# Patient Record
Sex: Male | Born: 1985
Health system: Southern US, Community
[De-identification: ages and names within clinical notes are randomized; demographics above are authoritative.]

## PROBLEM LIST (undated history)

## (undated) DIAGNOSIS — G8929 Other chronic pain: Secondary | ICD-10-CM

## (undated) DIAGNOSIS — Z9119 Patient's noncompliance with other medical treatment and regimen: Secondary | ICD-10-CM

## (undated) DIAGNOSIS — I10 Essential (primary) hypertension: Secondary | ICD-10-CM

## (undated) DIAGNOSIS — I509 Heart failure, unspecified: Secondary | ICD-10-CM

## (undated) DIAGNOSIS — Z91199 Patient's noncompliance with other medical treatment and regimen due to unspecified reason: Secondary | ICD-10-CM

## (undated) DIAGNOSIS — E785 Hyperlipidemia, unspecified: Secondary | ICD-10-CM

## (undated) DIAGNOSIS — I5042 Chronic combined systolic (congestive) and diastolic (congestive) heart failure: Secondary | ICD-10-CM

## (undated) DIAGNOSIS — N182 Chronic kidney disease, stage 2 (mild): Secondary | ICD-10-CM

## (undated) DIAGNOSIS — M549 Dorsalgia, unspecified: Secondary | ICD-10-CM

## (undated) HISTORY — PX: OTHER SURGICAL HISTORY: SHX169

---

## 2005-08-08 ENCOUNTER — Emergency Department (HOSPITAL_COMMUNITY): Admission: EM | Admit: 2005-08-08 | Discharge: 2005-08-08 | Payer: Self-pay | Admitting: Emergency Medicine

## 2005-08-09 ENCOUNTER — Emergency Department (HOSPITAL_COMMUNITY): Admission: EM | Admit: 2005-08-09 | Discharge: 2005-08-09 | Payer: Self-pay | Admitting: Emergency Medicine

## 2007-05-29 ENCOUNTER — Ambulatory Visit (HOSPITAL_COMMUNITY): Admission: EM | Admit: 2007-05-29 | Discharge: 2007-05-30 | Payer: Self-pay | Admitting: Emergency Medicine

## 2007-09-02 ENCOUNTER — Emergency Department (HOSPITAL_BASED_OUTPATIENT_CLINIC_OR_DEPARTMENT_OTHER): Admission: EM | Admit: 2007-09-02 | Discharge: 2007-09-02 | Payer: Self-pay | Admitting: Emergency Medicine

## 2008-03-10 ENCOUNTER — Ambulatory Visit: Payer: Self-pay | Admitting: Diagnostic Radiology

## 2008-03-10 ENCOUNTER — Emergency Department (HOSPITAL_BASED_OUTPATIENT_CLINIC_OR_DEPARTMENT_OTHER): Admission: EM | Admit: 2008-03-10 | Discharge: 2008-03-10 | Payer: Self-pay | Admitting: Emergency Medicine

## 2008-03-10 ENCOUNTER — Emergency Department (HOSPITAL_BASED_OUTPATIENT_CLINIC_OR_DEPARTMENT_OTHER): Admission: EM | Admit: 2008-03-10 | Discharge: 2008-03-11 | Payer: Self-pay | Admitting: Emergency Medicine

## 2008-08-17 ENCOUNTER — Emergency Department (HOSPITAL_BASED_OUTPATIENT_CLINIC_OR_DEPARTMENT_OTHER): Admission: EM | Admit: 2008-08-17 | Discharge: 2008-08-17 | Payer: Self-pay | Admitting: Emergency Medicine

## 2008-08-17 ENCOUNTER — Ambulatory Visit: Payer: Self-pay | Admitting: Diagnostic Radiology

## 2008-08-18 ENCOUNTER — Ambulatory Visit: Payer: Self-pay

## 2008-08-18 ENCOUNTER — Encounter: Payer: Self-pay | Admitting: Internal Medicine

## 2009-04-03 ENCOUNTER — Emergency Department (HOSPITAL_COMMUNITY): Admission: EM | Admit: 2009-04-03 | Discharge: 2009-04-03 | Payer: Self-pay | Admitting: Emergency Medicine

## 2010-05-01 LAB — BASIC METABOLIC PANEL
BUN: 10 mg/dL (ref 6–23)
GFR calc Af Amer: >60 mL/min (ref >60)
Glucose, Bld: 103 mg/dL — ABNORMAL HIGH (ref 70–99)
Sodium: 140 mEq/L (ref 135–145)

## 2010-05-01 LAB — DIFFERENTIAL
Basophils Absolute: 0.2 10*3/uL — ABNORMAL HIGH (ref 0.0–0.1)
Eosinophils Absolute: 0 10*3/uL (ref 0.0–0.7)
Eosinophils Relative: 0 % (ref 0–5)
Lymphocytes Relative: 5 % — ABNORMAL LOW (ref 12–46)
Lymphs Abs: 0.4 10*3/uL — ABNORMAL LOW (ref 0.7–4.0)
Monocytes Absolute: 0.7 10*3/uL (ref 0.1–1.0)
Monocytes Relative: 10 % (ref 3–12)
Neutro Abs: 5.8 10*3/uL (ref 1.7–7.7)

## 2010-05-01 LAB — CBC
HCT: 40.8 % (ref 39.0–52.0)
Hemoglobin: 13.8 g/dL (ref 13.0–17.0)
MCHC: 33.9 g/dL (ref 30.0–36.0)
MCV: 88.1 fL (ref 78.0–100.0)
WBC: 7.1 10*3/uL (ref 4.0–10.5)
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
## 2011-06-26 ENCOUNTER — Emergency Department (HOSPITAL_COMMUNITY)
Admission: EM | Admit: 2011-06-26 | Discharge: 2011-06-27 | Discharge status: Against Medical Advice | Payer: Medicaid Other | Attending: Emergency Medicine | Admitting: Emergency Medicine

## 2011-06-26 ENCOUNTER — Encounter (HOSPITAL_COMMUNITY): Payer: Self-pay | Admitting: Emergency Medicine

## 2011-06-26 DIAGNOSIS — N289 Disorder of kidney and ureter, unspecified: Secondary | ICD-10-CM | POA: Insufficient documentation

## 2011-06-26 DIAGNOSIS — E860 Dehydration: Secondary | ICD-10-CM | POA: Insufficient documentation

## 2011-06-26 DIAGNOSIS — R252 Cramp and spasm: Secondary | ICD-10-CM | POA: Insufficient documentation

## 2011-06-26 DIAGNOSIS — R1110 Vomiting, unspecified: Secondary | ICD-10-CM | POA: Insufficient documentation

## 2011-06-26 LAB — POCT I-STAT, CHEM 8
Calcium, Ion: 1.18 mmol/L (ref 1.12–1.32)
Glucose, Bld: 137 mg/dL — ABNORMAL HIGH (ref 70–99)
HCT: 55 % — ABNORMAL HIGH (ref 39.0–52.0)
Potassium: 5 mEq/L (ref 3.5–5.1)
TCO2: 24 mmol/L (ref 0–100)

## 2011-06-26 MED ORDER — SODIUM CHLORIDE 0.9 % IV BOLUS (SEPSIS)
1000.0000 mL | Freq: Once | INTRAVENOUS | Status: AC
  Administered 2011-06-26: 1000 mL via INTRAVENOUS

## 2011-06-26 MED ORDER — SODIUM CHLORIDE 0.9 % IV BOLUS (SEPSIS)
1000.0000 mL | Freq: Once | INTRAVENOUS | Status: AC
  Administered 2011-06-27: 1000 mL via INTRAVENOUS

## 2011-06-26 MED ORDER — POTASSIUM CHLORIDE CRYS ER 20 MEQ PO TBCR
40.0000 meq | EXTENDED_RELEASE_TABLET | Freq: Once | ORAL | Status: AC
  Administered 2011-06-26: 40 meq via ORAL
  Filled 2011-06-26: qty 2

## 2011-06-26 MED ORDER — DIAZEPAM 5 MG PO TABS
5.0000 mg | ORAL_TABLET | Freq: Once | ORAL | Status: AC
  Administered 2011-06-26: 5 mg via ORAL
  Filled 2011-06-26: qty 1

## 2011-06-26 MED ORDER — ONDANSETRON HCL 4 MG/2ML IJ SOLN
4.0000 mg | Freq: Once | INTRAMUSCULAR | Status: AC
  Administered 2011-06-26: 4 mg via INTRAVENOUS
  Filled 2011-06-26: qty 2

## 2011-06-26 NOTE — ED Notes (Signed)
Pt made aware of need for urine. Pt states not able to void at this time. Nurse at bedside while explaining to pt. Pt will notify staff if need to urinate.

## 2011-06-26 NOTE — ED Provider Notes (Signed)
History     CSN: [id number]  Arrival date & time 06/26/11  2048   First MD Initiated Contact with Patient 06/26/11 2311      Chief Complaint  Patient presents with  . Abdominal Cramping  . Emesis    (Consider location/radiation/quality/duration/timing/severity/associated sxs/prior treatment) The history is provided by the patient.   Works Aeronautical engineer. Has history of cramping and low potassium and believes that is going on tonight. Symptoms started around 4 PM after working in the heat all day long. Patient states last time this happened he required 6 bags of fluids and potassium. He is requesting something for muscle spasms. He has cramping in his legs and abdomen. He denies any fevers. Decreased urine output today. He denies alcohol use. No medications. Patient also concerned because he very much wants to be able to work in the morning. No fall or trauma. No nausea vomiting or diarrhea. No sick contacts at home. Moderate in severity.  History reviewed. No pertinent past medical history.  History reviewed. No pertinent past surgical history.  History reviewed. No pertinent family history.  History  Substance Use Topics  . Smoking status: Never Smoker   . Smokeless tobacco: Not on file  . Alcohol Use: No      Review of Systems  Constitutional: Negative for fever and chills.  HENT: Negative for neck pain and neck stiffness.   Eyes: Negative for pain.  Respiratory: Negative for shortness of breath.   Cardiovascular: Negative for chest pain.  Gastrointestinal: Negative for blood in stool and abdominal distention.  Genitourinary: Negative for dysuria.  Musculoskeletal: Negative for back pain.  Skin: Negative for rash.  Neurological: Negative for headaches.  All other systems reviewed and are negative.    Allergies  Review of patient's allergies indicates no known allergies.  Home Medications   Current Outpatient Rx  Name Route Sig Dispense Refill  . IBUPROFEN 200 MG  PO TABS Oral Take 200 mg by mouth every 6 (six) hours as needed. Pain      BP 142/83  Pulse 110  Temp 97 F (36.1 C) (Oral)  Resp 16  SpO2 98%  Physical Exam  Constitutional: He is oriented to person, place, and time. He appears well-developed and well-nourished.  HENT:  Head: Normocephalic and atraumatic.       Dry mucous membranes  Eyes: Conjunctivae and EOM are normal. Pupils are equal, round, and reactive to light.  Neck: Trachea normal. Neck supple. No thyromegaly present.  Cardiovascular: Regular rhythm, S1 normal, S2 normal and normal pulses.     No systolic murmur is present   No diastolic murmur is present  Pulses:      Radial pulses are 2+ on the right side, and 2+ on the left side.       Tachycardia  Pulmonary/Chest: Effort normal and breath sounds normal. He has no wheezes. He has no rhonchi. He has no rales. He exhibits no tenderness.  Abdominal: Soft. Normal appearance and bowel sounds are normal. There is no tenderness. There is no CVA tenderness and negative Murphy's sign.  Musculoskeletal:       BLE:s Calves nontender, no cords or erythema, negative Homans sign  Neurological: He is alert and oriented to person, place, and time. He has normal strength. No cranial nerve deficit or sensory deficit. GCS eye subscore is 4. GCS verbal subscore is 5. GCS motor subscore is 6.  Skin: Skin is warm and dry. No rash noted. He is not diaphoretic.  Psychiatric: His speech  is normal.       Cooperative and appropriate    ED Course  Procedures (including critical care time)  Results for orders placed during the hospital encounter of 06/26/11  CBC      Component Value Range   WBC 17.4 (*) 4.0 - 10.5 K/uL   RBC 5.70  4.22 - 5.81 MIL/uL   Hemoglobin 16.8  13.0 - 17.0 g/dL   HCT 16.1  09.6 - 04.5 %   MCV 85.8  78.0 - 100.0 fL   MCH 29.5  26.0 - 34.0 pg   MCHC 34.4  30.0 - 36.0 g/dL   RDW 40.9  81.1 - 91.4 %   Platelets 270  150 - 400 K/uL  URINALYSIS, ROUTINE W REFLEX  MICROSCOPIC      Component Value Range   Color, Urine YELLOW  YELLOW   APPearance CLEAR  CLEAR   Specific Gravity, Urine 1.019  1.005 - 1.030   pH 5.5  5.0 - 8.0   Glucose, UA NEGATIVE  NEGATIVE mg/dL   Hgb urine dipstick NEGATIVE  NEGATIVE   Bilirubin Urine NEGATIVE  NEGATIVE   Ketones, ur NEGATIVE  NEGATIVE mg/dL   Protein, ur 782 (*) NEGATIVE mg/dL   Urobilinogen, UA 0.2  0.0 - 1.0 mg/dL   Nitrite NEGATIVE  NEGATIVE   Leukocytes, UA NEGATIVE  NEGATIVE  POCT I-STAT, CHEM 8      Component Value Range   Sodium 136  135 - 145 mEq/L   Potassium 5.0  3.5 - 5.1 mEq/L   Chloride 101  96 - 112 mEq/L   BUN 28 (*) 6 - 23 mg/dL   Creatinine, Ser 9.56 (*) 0.50 - 1.35 mg/dL   Glucose, Bld 213 (*) 70 - 99 mg/dL   Calcium, Ion 0.86  5.78 - 1.32 mmol/L   TCO2 24  0 - 100 mmol/L   Hemoglobin 18.7 (*) 13.0 - 17.0 g/dL   HCT 46.9 (*) 62.9 - 52.8 %  URINE MICROSCOPIC-ADD ON      Component Value Range   Squamous Epithelial / LPF FEW (*) RARE   WBC, UA 0-2  <3 WBC/hpf   RBC / HPF 0-2  <3 RBC/hpf   Casts HYALINE CASTS (*) NEGATIVE   Crystals CA OXALATE CRYSTALS (*) NEGATIVE   Urine-Other TRICHOMONAS PRESENT     IV fluids provided. Valium for cramping provided.   After 2 L IV fluids, labs reviewed as above. I discussed with patient need for further hydration given elevated creatinine. He declines this and very much wants to leave AMA. Making urine and UA pending. Patient stated understanding my recommendations for further IV fluids and evaluation.  Prior to reassessment patient had nurse remove IV and left AMA.   MDM   Clinical dehydration with muscle cramping, improving in ED. Concerning labs as above. Nursing notes reviewed. Patient left AMA. UA reviewed after patient left. Trichomonas noted.        Sunnie Nielsen, MD 06/27/11 [phone]

## 2011-06-26 NOTE — ED Notes (Signed)
Pt reports abdominal cramping with nausea and vomiting, symptoms on and off since 4pm today, states he can not keep anything down. Pt also reports cramping on both his legs.  Pt also states "my chest feels like closing up"-- denies coughing. Denies headache.

## 2011-06-26 NOTE — ED Notes (Signed)
Pt has been up and down in the lobby, coming up to the window being loud about having to wait.  He has been in out of the front door at least once and c/o not being registered.  He had been called right after arriving by EMS, once in waiting room but must have gone outside when he was called.  Pt's family member had also gotten loud yelling out that there were a lot of people that went ahead of him.

## 2011-06-26 NOTE — ED Notes (Signed)
Attempting to triage and take vital signs on the patient. He is unable to sit still and obtain a BP. Wife at the chair. The patient reports that he water. While attempting a BP he took cuff off got up and went out into the waiting room to obtain water. The since the RN refused to give the patient water while triaging the patient. The patient refused to have his blood pressure checked. Patient was asked to wait in the waiting.

## 2011-06-26 NOTE — ED Notes (Signed)
Patient has been outside all day. Patient reports cramping and vomiting. The patient feels that he has drank enough water today. Patient brought in by ems

## 2011-06-27 LAB — URINALYSIS, ROUTINE W REFLEX MICROSCOPIC
Bilirubin Urine: NEGATIVE
Hgb urine dipstick: NEGATIVE
Leukocytes, UA: NEGATIVE
Protein, ur: 100 mg/dL — AB
Urobilinogen, UA: 0.2 mg/dL (ref 0.0–1.0)
pH: 5.5 (ref 5.0–8.0)

## 2011-06-27 LAB — CBC
Hemoglobin: 16.8 g/dL (ref 13.0–17.0)
MCV: 85.8 fL (ref 78.0–100.0)
Platelets: 270 10*3/uL (ref 150–400)
RBC: 5.7 MIL/uL (ref 4.22–5.81)
RDW: 12.6 % (ref 11.5–15.5)

## 2011-06-27 LAB — URINE MICROSCOPIC-ADD ON

## 2011-06-27 MED ORDER — SODIUM CHLORIDE 0.9 % IV BOLUS (SEPSIS)
1000.0000 mL | Freq: Once | INTRAVENOUS | Status: AC
  Administered 2011-06-27: 1000 mL via INTRAVENOUS

## 2011-06-27 NOTE — ED Notes (Signed)
Pt family informed Clinical research associate that pt is still not able to urinate. Informed pt of high need for urine. Pt states "I will not be able to urinate while here because I am not drinking anything." Informed pt that he is receiving fluids so he should be able to void soon.

## 2011-11-20 ENCOUNTER — Other Ambulatory Visit: Payer: Self-pay | Admitting: Internal Medicine

## 2011-11-20 ENCOUNTER — Ambulatory Visit
Admission: RE | Admit: 2011-11-20 | Discharge: 2011-11-20 | Disposition: A | Discharge status: Home / Self Care | Payer: Medicaid Other | Source: Ambulatory Visit | Attending: Internal Medicine | Admitting: Internal Medicine

## 2011-11-20 DIAGNOSIS — M25532 Pain in left wrist: Secondary | ICD-10-CM

## 2012-11-15 ENCOUNTER — Encounter (HOSPITAL_COMMUNITY): Payer: Self-pay | Admitting: Emergency Medicine

## 2012-11-15 ENCOUNTER — Emergency Department (HOSPITAL_COMMUNITY)
Admission: EM | Admit: 2012-11-15 | Discharge: 2012-11-15 | Disposition: A | Discharge status: Home / Self Care | Payer: Medicaid Other | Attending: Emergency Medicine | Admitting: Emergency Medicine

## 2012-11-15 DIAGNOSIS — M6580 Other synovitis and tenosynovitis, unspecified site: Secondary | ICD-10-CM | POA: Insufficient documentation

## 2012-11-15 DIAGNOSIS — M779 Enthesopathy, unspecified: Secondary | ICD-10-CM

## 2012-11-15 MED ORDER — IBUPROFEN 800 MG PO TABS: 800.0000 mg | ORAL_TABLET | Freq: Three times a day (TID) | ORAL | Status: DC

## 2012-11-15 NOTE — ED Provider Notes (Signed)
CSN: [id number]     Arrival date & time 11/15/12  1251 History  This chart was scribed for non-physician practitioner, Teressa Lower, NP working with Doug Sou, MD by Greggory Stallion, ED scribe. This patient was seen in room WTR7/WTR7 and the patient's care was started at 1:41 PM.   Chief Complaint  Patient presents with  . Numbness   The history is provided by the patient. No language interpreter was used.   HPI Comments: Stephen Ruiz is a 27 y.o. male who presents to the Emergency Department complaining of intermittent right hand numbness with associated upper arm pain that started 1-2 weeks ago. He states the episodes occur randomly. When they do occur, he states his hand changes color and his hand starts to swell. The episodes normally last about 2 hours and resolve on their own. The symptoms started before he started his new job. He states he has been dropping things because he can't hold onto them. Pt states it worsens at night to where he can't bend his fingers at all. He denies injury or heavy lifting. Pt has taken a muscle relaxer and ibuprofen with no relief. He denies neck pain.   History reviewed. No pertinent past medical history. No past surgical history on file. No family history on file. History  Substance Use Topics  . Smoking status: Never Smoker   . Smokeless tobacco: Not on file  . Alcohol Use: No    Review of Systems  Neurological: Positive for numbness.  All other systems reviewed and are negative.    Allergies  Review of patient's allergies indicates no known allergies.  Home Medications   Current Outpatient Rx  Name  Route  Sig  Dispense  Refill  . ibuprofen (ADVIL,MOTRIN) 200 MG tablet   Oral   Take 200 mg by mouth every 6 (six) hours as needed. Pain          BP 124/81  Pulse 83  Temp(Src) 99.5 F (37.5 C) (Oral)  Resp 16  SpO2 97%  Physical Exam  Nursing note and vitals reviewed. Constitutional: He is oriented to person, place,  and time. He appears well-developed and well-nourished. No distress.  HENT:  Head: Normocephalic and atraumatic.  Eyes: EOM are normal.  Neck: Neck supple. No tracheal deviation present.  Cardiovascular: Normal rate.   Pulmonary/Chest: Effort normal. No respiratory distress.  Musculoskeletal: Normal range of motion.  Pt tender in the medical right elbow  Neurological: He is alert and oriented to person, place, and time.  Equal grip strength bilaterally.  Skin: Skin is warm and dry.  Pulses intact:hand warm to touch  Psychiatric: He has a normal mood and affect. His behavior is normal.    ED Course  Procedures (including critical care time)  DIAGNOSTIC STUDIES: Oxygen Saturation is 97% on RA, normal by my interpretation.    COORDINATION OF CARE: 1:26 PM-Discussed treatment plan which includes referral to hand with pt at bedside and pt agreed to plan.   Labs Review Labs Reviewed - No data to display Imaging Review No results found.  EKG Interpretation   None       MDM   1. Tendonitis    Will treat pt symptomatically and refer to hand    I personally performed the services described in this documentation, which was scribed in my presence. The recorded information has been reviewed and is accurate.  Teressa Lower, NP 11/15/12 1420

## 2012-11-15 NOTE — ED Provider Notes (Signed)
Patient reports numbness in right hand and pain in his right hand palmar surface for the past 2 weeks. Pain is worse when he attempts to make a fist. Pain is intermittent. He been using Advil without relief. He is asymptomatic presently. On exam alert Glasgow Coma Score 15 respiratory rate and pulse 2+ all digits with full range of motion grip strength 5 over 5 overall alternates with good capillary refill. No swelling. I suspect the patient was tendinitis. 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 Plan suggest ibuprofen, hand surgeon referral.  Doug Sou, MD 11/15/12 1415

## 2012-11-15 NOTE — ED Notes (Signed)
Pt c/o rt hand numbness x 4 days.  States he has been dropping things due to this.  Denies injury.

## 2012-11-15 NOTE — ED Provider Notes (Signed)
Medical screening examination/treatment/procedure(s) were performed by non-physician practitioner and as supervising physician I was immediately available for consultation/collaboration.  EKG Interpretation   None        Kemonte Ullman, MD 11/15/12 1550 

## 2013-02-25 ENCOUNTER — Emergency Department (HOSPITAL_BASED_OUTPATIENT_CLINIC_OR_DEPARTMENT_OTHER): Payer: Medicaid Other

## 2013-02-25 ENCOUNTER — Encounter (HOSPITAL_BASED_OUTPATIENT_CLINIC_OR_DEPARTMENT_OTHER): Payer: Self-pay | Admitting: Emergency Medicine

## 2013-02-25 ENCOUNTER — Emergency Department (HOSPITAL_BASED_OUTPATIENT_CLINIC_OR_DEPARTMENT_OTHER)
Admission: EM | Admit: 2013-02-25 | Discharge: 2013-02-25 | Disposition: A | Discharge status: Home / Self Care | Payer: Medicaid Other | Attending: Emergency Medicine | Admitting: Emergency Medicine

## 2013-02-25 DIAGNOSIS — Z87828 Personal history of other (healed) physical injury and trauma: Secondary | ICD-10-CM | POA: Insufficient documentation

## 2013-02-25 DIAGNOSIS — Z791 Long term (current) use of non-steroidal anti-inflammatories (NSAID): Secondary | ICD-10-CM | POA: Insufficient documentation

## 2013-02-25 DIAGNOSIS — R52 Pain, unspecified: Secondary | ICD-10-CM | POA: Insufficient documentation

## 2013-02-25 DIAGNOSIS — Z79899 Other long term (current) drug therapy: Secondary | ICD-10-CM | POA: Insufficient documentation

## 2013-02-25 DIAGNOSIS — J069 Acute upper respiratory infection, unspecified: Secondary | ICD-10-CM | POA: Insufficient documentation

## 2013-02-25 MED ORDER — ALBUTEROL SULFATE HFA 108 (90 BASE) MCG/ACT IN AERS: 1.0000 | INHALATION_SPRAY | Freq: Four times a day (QID) | RESPIRATORY_TRACT | Status: DC | PRN

## 2013-02-25 NOTE — Discharge Instructions (Signed)

## 2013-02-25 NOTE — ED Notes (Signed)
Cough x 2 days. Was caring for his daughter who has a virus last week with the same symptoms.
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
## 2013-04-21 ENCOUNTER — Encounter (HOSPITAL_BASED_OUTPATIENT_CLINIC_OR_DEPARTMENT_OTHER): Payer: Self-pay | Admitting: Emergency Medicine

## 2013-04-21 ENCOUNTER — Emergency Department (HOSPITAL_BASED_OUTPATIENT_CLINIC_OR_DEPARTMENT_OTHER): Payer: Medicaid Other

## 2013-04-21 ENCOUNTER — Emergency Department (HOSPITAL_BASED_OUTPATIENT_CLINIC_OR_DEPARTMENT_OTHER)
Admission: EM | Admit: 2013-04-21 | Discharge: 2013-04-22 | Disposition: A | Discharge status: Home / Self Care | Payer: Medicaid Other | Attending: Emergency Medicine | Admitting: Emergency Medicine

## 2013-04-21 DIAGNOSIS — Z791 Long term (current) use of non-steroidal anti-inflammatories (NSAID): Secondary | ICD-10-CM | POA: Insufficient documentation

## 2013-04-21 DIAGNOSIS — H53149 Visual discomfort, unspecified: Secondary | ICD-10-CM | POA: Insufficient documentation

## 2013-04-21 DIAGNOSIS — R51 Headache: Secondary | ICD-10-CM | POA: Insufficient documentation

## 2013-04-21 DIAGNOSIS — R519 Headache, unspecified: Secondary | ICD-10-CM

## 2013-04-21 MED ORDER — KETOROLAC TROMETHAMINE 60 MG/2ML IM SOLN
60.0000 mg | Freq: Once | INTRAMUSCULAR | Status: AC
  Administered 2013-04-22: 60 mg via INTRAMUSCULAR
  Filled 2013-04-21: qty 2

## 2013-04-21 NOTE — ED Notes (Signed)
MD at bedside. 

## 2013-04-21 NOTE — ED Notes (Signed)
Patient transported to CT 
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
## 2013-04-21 NOTE — ED Notes (Signed)
Pt c/o h/a x 2 days 

## 2013-04-22 NOTE — Discharge Instructions (Signed)
Headaches, Frequently Asked Questions °MIGRAINE HEADACHES °Q: What is migraine? What causes it? How can I treat it? °A: Generally, migraine headaches begin as a dull ache. Then they develop into a constant, throbbing, and pulsating pain. You may experience pain at the temples. You may experience pain at the front or back of one or both sides of the head. The pain is usually accompanied by a combination of: °· Nausea. °· Vomiting. °· Sensitivity to light and noise. °Some people (about 15%) experience an aura (see below) before an attack. The cause of migraine is believed to be chemical reactions in the brain. Treatment for migraine may include over-the-counter or prescription medications. It may also include self-help techniques. These include relaxation training and biofeedback.  °Q: What is an aura? °A: About 15% of people with migraine get an "aura". This is a sign of neurological symptoms that occur before a migraine headache. You may see wavy or jagged lines, dots, or flashing lights. You might experience tunnel vision or blind spots in one or both eyes. The aura can include visual or auditory hallucinations (something imagined). It may include disruptions in smell (such as strange odors), taste or touch. Other symptoms include: °· Numbness. °· A "pins and needles" sensation. °· Difficulty in recalling or speaking the correct word. °These neurological events may last as long as 60 minutes. These symptoms will fade as the headache begins. °Q: What is a trigger? °A: Certain physical or environmental factors can lead to or "trigger" a migraine. These include: °· Foods. °· Hormonal changes. °· Weather. °· Stress. °It is important to remember that triggers are different for everyone. To help prevent migraine attacks, you need to figure out which triggers affect you. Keep a headache diary. This is a good way to track triggers. The diary will help you talk to your healthcare professional about your condition. °Q: Does  weather affect migraines? °A: Bright sunshine, hot, humid conditions, and drastic changes in barometric pressure may lead to, or "trigger," a migraine attack in some people. But studies have shown that weather does not act as a trigger for everyone with migraines. °Q: What is the link between migraine and hormones? °A: Hormones start and regulate many of your body's functions. Hormones keep your body in balance within a constantly changing environment. The levels of hormones in your body are unbalanced at times. Examples are during menstruation, pregnancy, or menopause. That can lead to a migraine attack. In fact, about three quarters of all women with migraine report that their attacks are related to the menstrual cycle.  °Q: Is there an increased risk of stroke for migraine sufferers? °A: The likelihood of a migraine attack causing a stroke is very remote. That is not to say that migraine sufferers cannot have a stroke associated with their migraines. In persons under age 40, the most common associated factor for stroke is migraine headache. But over the course of a person's normal life span, the occurrence of migraine headache may actually be associated with a reduced risk of dying from cerebrovascular disease due to stroke.  °Q: What are acute medications for migraine? °A: Acute medications are used to treat the pain of the headache after it has started. Examples over-the-counter medications, NSAIDs, ergots, and triptans.  °Q: What are the triptans? °A: Triptans are the newest class of abortive medications. They are specifically targeted to treat migraine. Triptans are vasoconstrictors. They moderate some chemical reactions in the brain. The triptans work on receptors in your brain. Triptans help   to restore the balance of a neurotransmitter called serotonin. Fluctuations in levels of serotonin are thought to be a main cause of migraine.  °Q: Are over-the-counter medications for migraine effective? °A:  Over-the-counter, or "OTC," medications may be effective in relieving mild to moderate pain and associated symptoms of migraine. But you should see your caregiver before beginning any treatment regimen for migraine.  °Q: What are preventive medications for migraine? °A: Preventive medications for migraine are sometimes referred to as "prophylactic" treatments. They are used to reduce the frequency, severity, and length of migraine attacks. Examples of preventive medications include antiepileptic medications, antidepressants, beta-blockers, calcium channel blockers, and NSAIDs (nonsteroidal anti-inflammatory drugs). °Q: Why are anticonvulsants used to treat migraine? °A: During the past few years, there has been an increased interest in antiepileptic drugs for the prevention of migraine. They are sometimes referred to as "anticonvulsants". Both epilepsy and migraine may be caused by similar reactions in the brain.  °Q: Why are antidepressants used to treat migraine? °A: Antidepressants are typically used to treat people with depression. They may reduce migraine frequency by regulating chemical levels, such as serotonin, in the brain.  °Q: What alternative therapies are used to treat migraine? °A: The term "alternative therapies" is often used to describe treatments considered outside the scope of conventional Western medicine. Examples of alternative therapy include acupuncture, acupressure, and yoga. Another common alternative treatment is herbal therapy. Some herbs are believed to relieve headache pain. Always discuss alternative therapies with your caregiver before proceeding. Some herbal products contain arsenic and other toxins. °TENSION HEADACHES °Q: What is a tension-type headache? What causes it? How can I treat it? °A: Tension-type headaches occur randomly. They are often the result of temporary stress, anxiety, fatigue, or anger. Symptoms include soreness in your temples, a tightening band-like sensation  around your head (a "vice-like" ache). Symptoms can also include a pulling feeling, pressure sensations, and contracting head and neck muscles. The headache begins in your forehead, temples, or the back of your head and neck. Treatment for tension-type headache may include over-the-counter or prescription medications. Treatment may also include self-help techniques such as relaxation training and biofeedback. °CLUSTER HEADACHES °Q: What is a cluster headache? What causes it? How can I treat it? °A: Cluster headache gets its name because the attacks come in groups. The pain arrives with little, if any, warning. It is usually on one side of the head. A tearing or bloodshot eye and a runny nose on the same side of the headache may also accompany the pain. Cluster headaches are believed to be caused by chemical reactions in the brain. They have been described as the most severe and intense of any headache type. Treatment for cluster headache includes prescription medication and oxygen. °SINUS HEADACHES °Q: What is a sinus headache? What causes it? How can I treat it? °A: When a cavity in the bones of the face and skull (a sinus) becomes inflamed, the inflammation will cause localized pain. This condition is usually the result of an allergic reaction, a tumor, or an infection. If your headache is caused by a sinus blockage, such as an infection, you will probably have a fever. An x-ray will confirm a sinus blockage. Your caregiver's treatment might include antibiotics for the infection, as well as antihistamines or decongestants.  °REBOUND HEADACHES °Q: What is a rebound headache? What causes it? How can I treat it? °A: A pattern of taking acute headache medications too often can lead to a condition known as "rebound headache."   A pattern of taking too much headache medication includes taking it more than 2 days per week or in excessive amounts. That means more than the label or a caregiver advises. With rebound  headaches, your medications not only stop relieving pain, they actually begin to cause headaches. Doctors treat rebound headache by tapering the medication that is being overused. Sometimes your caregiver will gradually substitute a different type of treatment or medication. Stopping may be a challenge. Regularly overusing a medication increases the potential for serious side effects. Consult a caregiver if you regularly use headache medications more than 2 days per week or more than the label advises. ADDITIONAL QUESTIONS AND ANSWERS Q: What is biofeedback? A: Biofeedback is a self-help treatment. Biofeedback uses special equipment to monitor your body's involuntary physical responses. Biofeedback monitors:  Breathing.  Pulse.  Heart rate.  Temperature.  Muscle tension.  Brain activity. Biofeedback helps you refine and perfect your relaxation exercises. You learn to control the physical responses that are related to stress. Once the technique has been mastered, you do not need the equipment any more. Q: Are headaches hereditary? A: Four out of five (80%) of people that suffer report a family history of migraine. Scientists are not sure if this is genetic or a family predisposition. Despite the uncertainty, a child has a 50% chance of having migraine if one parent suffers. The child has a 75% chance if both parents suffer.  Q: Can children get headaches? A: By the time they reach high school, most young people have experienced some type of headache. Many safe and effective approaches or medications can prevent a headache from occurring or stop it after it has begun.  Q: What type of doctor should I see to diagnose and treat my headache? A: Start with your primary caregiver. Discuss his or her experience and approach to headaches. Discuss methods of classification, diagnosis, and treatment. Your caregiver may decide to recommend you to a headache specialist, depending upon your symptoms or other  physical conditions. Having diabetes, allergies, etc., may require a more comprehensive and inclusive approach to your headache. The National Headache Foundation will provide, upon request, a list of [facility] physician members in your state. Document Released: 03/23/2003 Document Revised: 03/25/2011 Document Reviewed: 08/31/2007 [facility] Patient Information 2014 [place], [place].  Headaches, Frequently Asked Questions MIGRAINE HEADACHES Q: What is migraine? What causes it? How can I treat it? A: Generally, migraine headaches begin as a dull ache. Then they develop into a constant, throbbing, and pulsating pain. You may experience pain at the temples. You may experience pain at the front or back of one or both sides of the head. The pain is usually accompanied by a combination of:  Nausea.  Vomiting.  Sensitivity to light and noise. Some people (about 15%) experience an aura (see below) before an attack. The cause of migraine is believed to be chemical reactions in the brain. Treatment for migraine may include over-the-counter or prescription medications. It may also include self-help techniques. These include relaxation training and biofeedback.  Q: What is an aura? A: About 15% of people with migraine get an "aura". This is a sign of neurological symptoms that occur before a migraine headache. You may see wavy or jagged lines, dots, or flashing lights. You might experience tunnel vision or blind spots in one or both eyes. The aura can include visual or auditory hallucinations (something imagined). It may include disruptions in smell (such as strange odors), taste or touch. Other symptoms include:  Numbness.  A "pins and needles" sensation.  Difficulty in recalling or speaking the correct word. These neurological events may last as long as 60 minutes. These symptoms will fade as the headache begins. Q: What is a trigger? A: Certain physical or environmental factors can lead to or "trigger" a  migraine. These include:  Foods.  Hormonal changes.  Weather.  Stress. It is important to remember that triggers are different for everyone. To help prevent migraine attacks, you need to figure out which triggers affect you. Keep a headache diary. This is a good way to track triggers. The diary will help you talk to your healthcare professional about your condition. Q: Does weather affect migraines? A: Bright sunshine, hot, humid conditions, and drastic changes in barometric pressure may lead to, or "trigger," a migraine attack in some people. But studies have shown that weather does not act as a trigger for everyone with migraines. Q: What is the link between migraine and hormones? A: Hormones start and regulate many of your body's functions. Hormones keep your body in balance within a constantly changing environment. The levels of hormones in your body are unbalanced at times. Examples are during menstruation, pregnancy, or menopause. That can lead to a migraine attack. In fact, about three quarters of all women with migraine report that their attacks are related to the menstrual cycle.  Q: Is there an increased risk of stroke for migraine sufferers? A: The likelihood of a migraine attack causing a stroke is very remote. That is not to say that migraine sufferers cannot have a stroke associated with their migraines. In persons under age 51, the most common associated factor for stroke is migraine headache. But over the course of a person's normal life span, the occurrence of migraine headache may actually be associated with a reduced risk of dying from cerebrovascular disease due to stroke.  Q: What are acute medications for migraine? A: Acute medications are used to treat the pain of the headache after it has started. Examples over-the-counter medications, NSAIDs, ergots, and triptans.  Q: What are the triptans? A: Triptans are the newest class of abortive medications. They are specifically  targeted to treat migraine. Triptans are vasoconstrictors. They moderate some chemical reactions in the brain. The triptans work on receptors in your brain. Triptans help to restore the balance of a neurotransmitter called serotonin. Fluctuations in levels of serotonin are thought to be a main cause of migraine.  Q: Are over-the-counter medications for migraine effective? A: Over-the-counter, or "OTC," medications may be effective in relieving mild to moderate pain and associated symptoms of migraine. But you should see your caregiver before beginning any treatment regimen for migraine.  Q: What are preventive medications for migraine? A: Preventive medications for migraine are sometimes referred to as "prophylactic" treatments. They are used to reduce the frequency, severity, and length of migraine attacks. Examples of preventive medications include antiepileptic medications, antidepressants, beta-blockers, calcium channel blockers, and NSAIDs (nonsteroidal anti-inflammatory drugs). Q: Why are anticonvulsants used to treat migraine? A: During the past few years, there has been an increased interest in antiepileptic drugs for the prevention of migraine. They are sometimes referred to as "anticonvulsants". Both epilepsy and migraine may be caused by similar reactions in the brain.  Q: Why are antidepressants used to treat migraine? A: Antidepressants are typically used to treat people with depression. They may reduce migraine frequency by regulating chemical levels, such as serotonin, in the brain.  Q: What alternative therapies are used to treat migraine? A: The  term "alternative therapies" is often used to describe treatments considered outside the scope of conventional Western medicine. Examples of alternative therapy include acupuncture, acupressure, and yoga. Another common alternative treatment is herbal therapy. Some herbs are believed to relieve headache pain. Always discuss alternative therapies  with your caregiver before proceeding. Some herbal products contain arsenic and other toxins. TENSION HEADACHES Q: What is a tension-type headache? What causes it? How can I treat it? A: Tension-type headaches occur randomly. They are often the result of temporary stress, anxiety, fatigue, or anger. Symptoms include soreness in your temples, a tightening band-like sensation around your head (a "vice-like" ache). Symptoms can also include a pulling feeling, pressure sensations, and contracting head and neck muscles. The headache begins in your forehead, temples, or the back of your head and neck. Treatment for tension-type headache may include over-the-counter or prescription medications. Treatment may also include self-help techniques such as relaxation training and biofeedback. CLUSTER HEADACHES Q: What is a cluster headache? What causes it? How can I treat it? A: Cluster headache gets its name because the attacks come in groups. The pain arrives with little, if any, warning. It is usually on one side of the head. A tearing or bloodshot eye and a runny nose on the same side of the headache may also accompany the pain. Cluster headaches are believed to be caused by chemical reactions in the brain. They have been described as the most severe and intense of any headache type. Treatment for cluster headache includes prescription medication and oxygen. SINUS HEADACHES Q: What is a sinus headache? What causes it? How can I treat it? A: When a cavity in the bones of the face and skull (a sinus) becomes inflamed, the inflammation will cause localized pain. This condition is usually the result of an allergic reaction, a tumor, or an infection. If your headache is caused by a sinus blockage, such as an infection, you will probably have a fever. An x-ray will confirm a sinus blockage. Your caregiver's treatment might include antibiotics for the infection, as well as antihistamines or decongestants.  REBOUND  HEADACHES Q: What is a rebound headache? What causes it? How can I treat it? A: A pattern of taking acute headache medications too often can lead to a condition known as "rebound headache." A pattern of taking too much headache medication includes taking it more than 2 days per week or in excessive amounts. That means more than the label or a caregiver advises. With rebound headaches, your medications not only stop relieving pain, they actually begin to cause headaches. Doctors treat rebound headache by tapering the medication that is being overused. Sometimes your caregiver will gradually substitute a different type of treatment or medication. Stopping may be a challenge. Regularly overusing a medication increases the potential for serious side effects. Consult a caregiver if you regularly use headache medications more than 2 days per week or more than the label advises. ADDITIONAL QUESTIONS AND ANSWERS Q: What is biofeedback? A: Biofeedback is a self-help treatment. Biofeedback uses special equipment to monitor your body's involuntary physical responses. Biofeedback monitors:  Breathing.  Pulse.  Heart rate.  Temperature.  Muscle tension.  Brain activity. Biofeedback helps you refine and perfect your relaxation exercises. You learn to control the physical responses that are related to stress. Once the technique has been mastered, you do not need the equipment any more. Q: Are headaches hereditary? A: Four out of five (80%) of people that suffer report a family history of migraine. Scientists  are not sure if this is genetic or a family predisposition. Despite the uncertainty, a child has a 50% chance of having migraine if one parent suffers. The child has a 75% chance if both parents suffer.  Q: Can children get headaches? A: By the time they reach high school, most young people have experienced some type of headache. Many safe and effective approaches or medications can prevent a headache  from occurring or stop it after it has begun.  Q: What type of doctor should I see to diagnose and treat my headache? A: Start with your primary caregiver. Discuss his or her experience and approach to headaches. Discuss methods of classification, diagnosis, and treatment. Your caregiver may decide to recommend you to a headache specialist, depending upon your symptoms or other physical conditions. Having diabetes, allergies, etc., may require a more comprehensive and inclusive approach to your headache. The National Headache Foundation will provide, upon request, a list of [facility] physician members in your state. Document Released: 03/23/2003 Document Revised: 03/25/2011 Document Reviewed: 08/31/2007 [facility] Patient Information 2014 [employer], [place].

## 2014-01-04 ENCOUNTER — Emergency Department (HOSPITAL_COMMUNITY)
Admission: EM | Admit: 2014-01-04 | Discharge: 2014-01-04 | Disposition: A | Discharge status: Home / Self Care | Payer: 59 | Attending: Emergency Medicine | Admitting: Emergency Medicine

## 2014-01-04 ENCOUNTER — Emergency Department (HOSPITAL_COMMUNITY): Payer: 59

## 2014-01-04 ENCOUNTER — Encounter (HOSPITAL_COMMUNITY): Payer: Self-pay | Admitting: Emergency Medicine

## 2014-01-04 DIAGNOSIS — M25511 Pain in right shoulder: Secondary | ICD-10-CM

## 2014-01-04 DIAGNOSIS — Y998 Other external cause status: Secondary | ICD-10-CM | POA: Diagnosis not present

## 2014-01-04 DIAGNOSIS — Y929 Unspecified place or not applicable: Secondary | ICD-10-CM | POA: Insufficient documentation

## 2014-01-04 DIAGNOSIS — W11XXXA Fall on and from ladder, initial encounter: Secondary | ICD-10-CM | POA: Insufficient documentation

## 2014-01-04 DIAGNOSIS — Y9389 Activity, other specified: Secondary | ICD-10-CM | POA: Diagnosis not present

## 2014-01-04 DIAGNOSIS — Z79899 Other long term (current) drug therapy: Secondary | ICD-10-CM | POA: Insufficient documentation

## 2014-01-04 DIAGNOSIS — S4991XA Unspecified injury of right shoulder and upper arm, initial encounter: Secondary | ICD-10-CM | POA: Insufficient documentation

## 2014-01-04 DIAGNOSIS — S6991XA Unspecified injury of right wrist, hand and finger(s), initial encounter: Secondary | ICD-10-CM | POA: Insufficient documentation

## 2014-01-04 DIAGNOSIS — R52 Pain, unspecified: Secondary | ICD-10-CM

## 2014-01-04 DIAGNOSIS — M79641 Pain in right hand: Secondary | ICD-10-CM

## 2014-01-04 MED ORDER — TRAMADOL HCL 50 MG PO TABS: 50.0000 mg | ORAL_TABLET | Freq: Four times a day (QID) | ORAL | Status: DC | PRN
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
## 2014-01-04 NOTE — ED Notes (Signed)
XR is backed up. PA notified.

## 2014-01-04 NOTE — ED Notes (Signed)
Pt transported to XR.  

## 2014-01-04 NOTE — ED Notes (Signed)
Pt states that he stepped off the next to bottom rung of a ladder, thinking it was the bottom last week.  C/o rt shoulder and rt hand pain.

## 2014-01-04 NOTE — Discharge Instructions (Signed)

## 2014-05-08 ENCOUNTER — Encounter (HOSPITAL_BASED_OUTPATIENT_CLINIC_OR_DEPARTMENT_OTHER): Payer: Self-pay | Admitting: *Deleted

## 2014-05-08 ENCOUNTER — Emergency Department (HOSPITAL_BASED_OUTPATIENT_CLINIC_OR_DEPARTMENT_OTHER): Payer: 59

## 2014-05-08 ENCOUNTER — Emergency Department (HOSPITAL_BASED_OUTPATIENT_CLINIC_OR_DEPARTMENT_OTHER)
Admission: EM | Admit: 2014-05-08 | Discharge: 2014-05-08 | Discharge status: Against Medical Advice | Payer: 59 | Attending: Emergency Medicine | Admitting: Emergency Medicine

## 2014-05-08 DIAGNOSIS — Y9389 Activity, other specified: Secondary | ICD-10-CM | POA: Insufficient documentation

## 2014-05-08 DIAGNOSIS — Y998 Other external cause status: Secondary | ICD-10-CM | POA: Insufficient documentation

## 2014-05-08 DIAGNOSIS — S81011A Laceration without foreign body, right knee, initial encounter: Secondary | ICD-10-CM | POA: Insufficient documentation

## 2014-05-08 DIAGNOSIS — Z23 Encounter for immunization: Secondary | ICD-10-CM | POA: Diagnosis not present

## 2014-05-08 DIAGNOSIS — S81031A Puncture wound without foreign body, right knee, initial encounter: Secondary | ICD-10-CM | POA: Diagnosis not present

## 2014-05-08 DIAGNOSIS — S8991XA Unspecified injury of right lower leg, initial encounter: Secondary | ICD-10-CM | POA: Diagnosis present

## 2014-05-08 DIAGNOSIS — W260XXA Contact with knife, initial encounter: Secondary | ICD-10-CM | POA: Diagnosis not present

## 2014-05-08 DIAGNOSIS — Y92096 Garden or yard of other non-institutional residence as the place of occurrence of the external cause: Secondary | ICD-10-CM | POA: Diagnosis not present

## 2014-05-08 DIAGNOSIS — T148XXA Other injury of unspecified body region, initial encounter: Secondary | ICD-10-CM

## 2014-05-08 DIAGNOSIS — M25561 Pain in right knee: Secondary | ICD-10-CM

## 2014-05-08 MED ORDER — CEPHALEXIN 500 MG PO CAPS: 500.0000 mg | ORAL_CAPSULE | Freq: Four times a day (QID) | ORAL | Status: DC

## 2014-05-08 MED ORDER — TETANUS-DIPHTH-ACELL PERTUSSIS 5-2.5-18.5 LF-MCG/0.5 IM SUSP
0.5000 mL | Freq: Once | INTRAMUSCULAR | Status: AC
  Administered 2014-05-08: 0.5 mL via INTRAMUSCULAR
  Filled 2014-05-08: qty 0.5
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
## 2014-05-09 ENCOUNTER — Other Ambulatory Visit (HOSPITAL_COMMUNITY): Payer: Self-pay | Admitting: Orthopedic Surgery

## 2014-05-09 DIAGNOSIS — M25561 Pain in right knee: Secondary | ICD-10-CM

## 2014-05-27 ENCOUNTER — Ambulatory Visit (HOSPITAL_COMMUNITY): Payer: 59

## 2015-01-24 DIAGNOSIS — M25561 Pain in right knee: Secondary | ICD-10-CM | POA: Diagnosis not present

## 2015-11-03 ENCOUNTER — Emergency Department (HOSPITAL_COMMUNITY)
Admission: EM | Admit: 2015-11-03 | Discharge: 2015-11-03 | Disposition: A | Discharge status: Home / Self Care | Payer: 59 | Attending: Dermatology | Admitting: Dermatology

## 2015-11-03 ENCOUNTER — Encounter (HOSPITAL_COMMUNITY): Payer: Self-pay | Admitting: Emergency Medicine

## 2015-11-03 DIAGNOSIS — H9202 Otalgia, left ear: Secondary | ICD-10-CM | POA: Insufficient documentation

## 2015-11-03 DIAGNOSIS — R51 Headache: Secondary | ICD-10-CM | POA: Insufficient documentation

## 2015-11-03 DIAGNOSIS — F1290 Cannabis use, unspecified, uncomplicated: Secondary | ICD-10-CM | POA: Insufficient documentation

## 2015-11-03 DIAGNOSIS — Z5321 Procedure and treatment not carried out due to patient leaving prior to being seen by health care provider: Secondary | ICD-10-CM | POA: Insufficient documentation

## 2015-11-03 DIAGNOSIS — Z79899 Other long term (current) drug therapy: Secondary | ICD-10-CM | POA: Insufficient documentation

## 2015-11-03 HISTORY — DX: Dorsalgia, unspecified: M54.9

## 2015-11-03 HISTORY — DX: Other chronic pain: G89.29

## 2015-11-03 MED ORDER — IBUPROFEN 200 MG PO TABS
400.0000 mg | ORAL_TABLET | Freq: Once | ORAL | Status: AC | PRN
  Administered 2015-11-03: 400 mg via ORAL
  Filled 2015-11-03: qty 2

## 2015-11-03 NOTE — ED Notes (Signed)
Pt was called twice with no response.  

## 2015-11-03 NOTE — ED Notes (Signed)
PT called from lobby to minor care room without response.

## 2015-11-03 NOTE — ED Notes (Signed)
Pt called from lobby to minor care room without response.

## 2015-11-03 NOTE — ED Triage Notes (Signed)
Pt complaint of left ear pain without drainage onset 3 days ago. Pt reports associated left headache and facial pain. Pt takes percocet for chronic back pain and "it is not touching this pain."

## 2015-11-03 NOTE — ED Notes (Signed)
Bed: WTR6 Expected date:  Expected time:  Means of arrival:  Comments: 

## 2016-08-10 ENCOUNTER — Encounter (HOSPITAL_BASED_OUTPATIENT_CLINIC_OR_DEPARTMENT_OTHER): Payer: Self-pay | Admitting: Emergency Medicine

## 2016-08-10 ENCOUNTER — Emergency Department (HOSPITAL_BASED_OUTPATIENT_CLINIC_OR_DEPARTMENT_OTHER)
Admission: EM | Admit: 2016-08-10 | Discharge: 2016-08-11 | Disposition: A | Discharge status: Home / Self Care | Payer: Self-pay | Attending: Physician Assistant | Admitting: Physician Assistant

## 2016-08-10 ENCOUNTER — Emergency Department (HOSPITAL_BASED_OUTPATIENT_CLINIC_OR_DEPARTMENT_OTHER): Payer: Self-pay

## 2016-08-10 DIAGNOSIS — F17200 Nicotine dependence, unspecified, uncomplicated: Secondary | ICD-10-CM | POA: Insufficient documentation

## 2016-08-10 DIAGNOSIS — M25511 Pain in right shoulder: Secondary | ICD-10-CM | POA: Insufficient documentation

## 2016-08-10 NOTE — ED Triage Notes (Signed)
PT presents with c/o of right shoulder pain for the past week

## 2016-08-10 NOTE — ED Notes (Signed)
Last two tdays, right shoulder pain feels like stabbing,throbbing and shooting that goes to his neck per patient.
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
## 2016-08-11 MED ORDER — CYCLOBENZAPRINE HCL 10 MG PO TABS: 10.0000 mg | ORAL_TABLET | Freq: Two times a day (BID) | ORAL | 0 refills | Status: DC | PRN

## 2016-08-11 NOTE — Discharge Instructions (Signed)
We want you to rest ice and continued to stretch your right shoulder. It is possible he will need imaging the future. Please follow-up with referral given for continued care of the shoulder.

## 2017-01-05 ENCOUNTER — Encounter (HOSPITAL_BASED_OUTPATIENT_CLINIC_OR_DEPARTMENT_OTHER): Payer: Self-pay

## 2017-01-05 ENCOUNTER — Emergency Department (HOSPITAL_BASED_OUTPATIENT_CLINIC_OR_DEPARTMENT_OTHER)
Admission: EM | Admit: 2017-01-05 | Discharge: 2017-01-05 | Disposition: A | Discharge status: Home / Self Care | Payer: Self-pay | Attending: Emergency Medicine | Admitting: Emergency Medicine

## 2017-01-05 ENCOUNTER — Other Ambulatory Visit: Payer: Self-pay

## 2017-01-05 DIAGNOSIS — F17290 Nicotine dependence, other tobacco product, uncomplicated: Secondary | ICD-10-CM | POA: Insufficient documentation

## 2017-01-05 DIAGNOSIS — H6692 Otitis media, unspecified, left ear: Secondary | ICD-10-CM | POA: Insufficient documentation

## 2017-01-05 DIAGNOSIS — H6690 Otitis media, unspecified, unspecified ear: Secondary | ICD-10-CM

## 2017-01-05 MED ORDER — FLUTICASONE PROPIONATE 50 MCG/ACT NA SUSP: 2.0000 | Freq: Every day | NASAL | 12 refills | Status: DC

## 2017-01-05 MED ORDER — AMOXICILLIN 500 MG PO CAPS: 500.0000 mg | ORAL_CAPSULE | Freq: Three times a day (TID) | ORAL | 0 refills | Status: DC

## 2017-01-05 NOTE — Discharge Instructions (Signed)
The antibiotics as prescribed.  Use the Flonase for nasal congestion.  May use over-the-counter decongestant such as Sudafed.  Follow-up with primary care doctor.  Return to the ED with any worsening symptoms.

## 2017-01-05 NOTE — ED Triage Notes (Signed)
Pt presents to the ED for a CC of left ear pain. Symptom onset was 3 days ago. Associated cough, and nasal congestion. States OTC ear gtts were ineffective.
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
## 2017-12-22 ENCOUNTER — Inpatient Hospital Stay (HOSPITAL_BASED_OUTPATIENT_CLINIC_OR_DEPARTMENT_OTHER)
Admission: EM | Admit: 2017-12-22 | Discharge: 2017-12-23 | DRG: 293 | Discharge status: Against Medical Advice | Payer: Self-pay | Attending: Emergency Medicine | Admitting: Emergency Medicine

## 2017-12-22 ENCOUNTER — Emergency Department (HOSPITAL_BASED_OUTPATIENT_CLINIC_OR_DEPARTMENT_OTHER): Payer: Self-pay

## 2017-12-22 ENCOUNTER — Other Ambulatory Visit: Payer: Self-pay

## 2017-12-22 DIAGNOSIS — F1290 Cannabis use, unspecified, uncomplicated: Secondary | ICD-10-CM | POA: Diagnosis present

## 2017-12-22 DIAGNOSIS — Z79891 Long term (current) use of opiate analgesic: Secondary | ICD-10-CM

## 2017-12-22 DIAGNOSIS — Z79899 Other long term (current) drug therapy: Secondary | ICD-10-CM

## 2017-12-22 DIAGNOSIS — I509 Heart failure, unspecified: Principal | ICD-10-CM | POA: Diagnosis present

## 2017-12-22 DIAGNOSIS — Z5329 Procedure and treatment not carried out because of patient's decision for other reasons: Secondary | ICD-10-CM | POA: Diagnosis not present

## 2017-12-22 DIAGNOSIS — Z7951 Long term (current) use of inhaled steroids: Secondary | ICD-10-CM

## 2017-12-22 DIAGNOSIS — G8929 Other chronic pain: Secondary | ICD-10-CM | POA: Diagnosis present

## 2017-12-22 NOTE — ED Notes (Signed)
Pt states that at times he feels like the walls are closing in on him and that he needs to go outside in the cool air and take his clothes off. Pt also describing a sensation of knots in his stomach. Pt states all symptoms are intermittent. Pt appearing to become noticeably anxious as he was talking about his symptoms and became diaphoretic.

## 2017-12-22 NOTE — ED Triage Notes (Addendum)
Pt c/o SOB x 1wk, no fever, but has night sweats. Pt describes anxiety sx's and sx's of sleep apnea. Pt smells of marijuana. Breathing is not labored.

## 2017-12-23 ENCOUNTER — Encounter (HOSPITAL_COMMUNITY): Payer: Self-pay | Admitting: *Deleted

## 2017-12-23 ENCOUNTER — Observation Stay (HOSPITAL_COMMUNITY)
Admission: EM | Admit: 2017-12-23 | Discharge: 2017-12-25 | Disposition: A | Discharge status: Home / Self Care | Payer: Self-pay | Attending: Internal Medicine | Admitting: Internal Medicine

## 2017-12-23 ENCOUNTER — Other Ambulatory Visit: Payer: Self-pay

## 2017-12-23 DIAGNOSIS — F17290 Nicotine dependence, other tobacco product, uncomplicated: Secondary | ICD-10-CM | POA: Insufficient documentation

## 2017-12-23 DIAGNOSIS — I509 Heart failure, unspecified: Secondary | ICD-10-CM

## 2017-12-23 DIAGNOSIS — M549 Dorsalgia, unspecified: Secondary | ICD-10-CM | POA: Insufficient documentation

## 2017-12-23 DIAGNOSIS — I5021 Acute systolic (congestive) heart failure: Secondary | ICD-10-CM | POA: Insufficient documentation

## 2017-12-23 DIAGNOSIS — E785 Hyperlipidemia, unspecified: Secondary | ICD-10-CM | POA: Insufficient documentation

## 2017-12-23 DIAGNOSIS — Z8249 Family history of ischemic heart disease and other diseases of the circulatory system: Secondary | ICD-10-CM | POA: Insufficient documentation

## 2017-12-23 DIAGNOSIS — Z7951 Long term (current) use of inhaled steroids: Secondary | ICD-10-CM | POA: Insufficient documentation

## 2017-12-23 DIAGNOSIS — I110 Hypertensive heart disease with heart failure: Principal | ICD-10-CM | POA: Insufficient documentation

## 2017-12-23 DIAGNOSIS — G8929 Other chronic pain: Secondary | ICD-10-CM | POA: Insufficient documentation

## 2017-12-23 LAB — CBC WITH DIFFERENTIAL/PLATELET
Abs Immature Granulocytes: 0.03 10*3/uL (ref 0.00–0.07)
BASOS ABS: 0.1 10*3/uL (ref 0.0–0.1)
BASOS PCT: 1 %
EOS ABS: 0.2 10*3/uL (ref 0.0–0.5)
EOS PCT: 2 %
HCT: 48.1 % (ref 39.0–52.0)
Hemoglobin: 14.7 g/dL (ref 13.0–17.0)
IMMATURE GRANULOCYTES: 0 %
LYMPHS ABS: 3.6 10*3/uL (ref 0.7–4.0)
LYMPHS PCT: 41 %
MCH: 27.8 pg (ref 26.0–34.0)
MCHC: 30.6 g/dL (ref 30.0–36.0)
MCV: 91.1 fL (ref 80.0–100.0)
Monocytes Absolute: 0.6 10*3/uL (ref 0.1–1.0)
Monocytes Relative: 7 %
NEUTROS ABS: 4.4 10*3/uL (ref 1.7–7.7)
NEUTROS PCT: 49 %
PLATELETS: 285 10*3/uL (ref 150–400)
RBC: 5.28 MIL/uL (ref 4.22–5.81)
RDW: 13.2 % (ref 11.5–15.5)
WBC: 8.8 10*3/uL (ref 4.0–10.5)
nRBC: 0 % (ref 0.0–0.2)

## 2017-12-23 LAB — BASIC METABOLIC PANEL
Anion gap: 7 (ref 5–15)
BUN: 9 mg/dL (ref 6–20)
CO2: 25 mmol/L (ref 22–32)
CREATININE: 1.25 mg/dL — AB (ref 0.61–1.24)
Calcium: 9.2 mg/dL (ref 8.9–10.3)
Chloride: 103 mmol/L (ref 98–111)
GFR calc Af Amer: >60 mL/min (ref >60)
GLUCOSE: 125 mg/dL — AB (ref 70–99)
Potassium: 3.5 mmol/L (ref 3.5–5.1)
Sodium: 135 mmol/L (ref 135–145)

## 2017-12-23 LAB — BRAIN NATRIURETIC PEPTIDE: B Natriuretic Peptide: 236.1 pg/mL — ABNORMAL HIGH (ref 0.0–100.0)

## 2017-12-23 LAB — RAPID URINE DRUG SCREEN, HOSP PERFORMED
Amphetamines: NOT DETECTED
Barbiturates: NOT DETECTED
Benzodiazepines: NOT DETECTED
Cocaine: NOT DETECTED
Opiates: NOT DETECTED
Tetrahydrocannabinol: POSITIVE — AB

## 2017-12-23 LAB — D-DIMER, QUANTITATIVE (NOT AT ARMC): D DIMER QUANT: 0.33 ug{FEU}/mL (ref 0.00–0.50)

## 2017-12-23 LAB — TROPONIN I
Troponin I: 0.03 ng/mL (ref <0.03)
Troponin I: 0.03 ng/mL (ref <0.03)
Troponin I: <0.03 ng/mL (ref <0.03)

## 2017-12-23 MED ORDER — ENOXAPARIN SODIUM 40 MG/0.4ML ~~LOC~~ SOLN
40.0000 mg | SUBCUTANEOUS | Status: DC
  Administered 2017-12-23: 40 mg via SUBCUTANEOUS
  Filled 2017-12-23: qty 0.4

## 2017-12-23 MED ORDER — ONDANSETRON HCL 4 MG/2ML IJ SOLN: 4.0000 mg | Freq: Four times a day (QID) | INTRAMUSCULAR | Status: DC | PRN

## 2017-12-23 MED ORDER — FUROSEMIDE 40 MG PO TABS
20.0000 mg | ORAL_TABLET | Freq: Once | ORAL | Status: AC
  Administered 2017-12-23: 20 mg via ORAL
  Filled 2017-12-23: qty 1

## 2017-12-23 MED ORDER — POLYETHYLENE GLYCOL 3350 17 G PO PACK: 17.0000 g | PACK | Freq: Every day | ORAL | Status: DC | PRN

## 2017-12-23 MED ORDER — FUROSEMIDE 10 MG/ML IJ SOLN
40.0000 mg | Freq: Once | INTRAMUSCULAR | Status: AC
  Administered 2017-12-23: 40 mg via INTRAVENOUS
  Filled 2017-12-23: qty 4

## 2017-12-23 MED ORDER — ONDANSETRON HCL 4 MG PO TABS: 4.0000 mg | ORAL_TABLET | Freq: Four times a day (QID) | ORAL | Status: DC | PRN

## 2017-12-23 MED ORDER — ACETAMINOPHEN 650 MG RE SUPP: 650.0000 mg | Freq: Four times a day (QID) | RECTAL | Status: DC | PRN

## 2017-12-23 MED ORDER — AMLODIPINE BESYLATE 5 MG PO TABS
5.0000 mg | ORAL_TABLET | Freq: Once | ORAL | Status: AC
  Administered 2017-12-23: 5 mg via ORAL
  Filled 2017-12-23: qty 1

## 2017-12-23 MED ORDER — FUROSEMIDE 10 MG/ML IJ SOLN
40.0000 mg | Freq: Two times a day (BID) | INTRAMUSCULAR | Status: DC
  Administered 2017-12-24: 40 mg via INTRAVENOUS
  Filled 2017-12-23: qty 4

## 2017-12-23 MED ORDER — ACETAMINOPHEN 325 MG PO TABS: 650.0000 mg | ORAL_TABLET | Freq: Four times a day (QID) | ORAL | Status: DC | PRN

## 2017-12-23 MED ORDER — NITROGLYCERIN 2 % TD OINT
0.5000 [in_us] | TOPICAL_OINTMENT | Freq: Once | TRANSDERMAL | Status: AC
  Administered 2017-12-23: 0.5 [in_us] via TOPICAL
  Filled 2017-12-23: qty 1

## 2017-12-23 NOTE — ED Notes (Signed)
Attempt report x1  

## 2017-12-23 NOTE — ED Notes (Signed)
ED TO INPATIENT HANDOFF REPORT  Name/Age/Gender Stephen Ruiz 32 y.o. male  Code Status   Home/SNF/Other Home  Chief Complaint not able to breathe   Level of Care/Admitting Diagnosis ED Disposition    ED Disposition Condition Comment   Admit  Hospital Area: [facility] [[id number]]  Level of Care: Telemetry [5]  Admit to tele based on following criteria: Acute CHF  Diagnosis: Acute CHF (congestive heart failure) [facility]) [[id number]]  Admitting Physician: Onnie Boer [phone]  Attending Physician: Onnie Boer Xenia.Douglas  PT Class (Do Not Modify): Observation [104]  PT Acc Code (Do Not Modify): Observation [10022]       Medical History Past Medical History:  Diagnosis Date  . Chronic back pain     Allergies No Known Allergies  IV Location/Drains/Wounds Patient Lines/Drains/Airways Status   Active Line/Drains/Airways    Name:   Placement date:   Placement time:   Site:   Days:   Peripheral IV 12/23/17 Left Hand   12/23/17    1950    Hand   less than 1          Labs/Imaging Results for orders placed or performed during the hospital encounter of 12/23/17 (from the past 48 hour(s))  Troponin I - ONCE - STAT     Status: Abnormal   Collection Time: 12/23/17  2:01 PM  Result Value Ref Range   Troponin I 0.03 (HH) <0.03 ng/mL    Comment: CRITICAL RESULT CALLED TO, READ BACK BY AND VERIFIED WITH: S.Pati Thinnes AT 1628 ON 12/23/17 BY N.THOMPSON Performed at [facility], [address] [place], [place] [address]    Dg Chest 2 View  Result Date: 12/22/2017 CLINICAL DATA:  Shortness of breath EXAM: CHEST - 2 VIEW COMPARISON:  02/25/2013 FINDINGS: Mild cardiomegaly with central vascular congestion. No acute airspace disease or pleural effusion. No pneumothorax. IMPRESSION: Cardiomegaly with central vascular congestion. Electronically Signed   By: Jasmine Pang M.D.   On: 12/22/2017 21:49   EKG  Interpretation  Date/Time:  Tuesday December 23 2017 13:17:02 EST Ventricular Rate:  107 PR Interval:    QRS Duration: 141 QT Interval:  344 QTC Calculation: 459 R Axis:   -4 Text Interpretation:  Sinus tachycardia LAE, consider biatrial enlargement Nonspecific intraventricular conduction delay Abnormal T, consider ischemia, diffuse leads No significant change since last tracing Confirmed by Jacalyn Lefevre [phone]) on 12/23/2017 1:58:52 PM   Pending Labs Unresulted Labs (From admission, onward)    Start     Ordered   12/23/17 2000  Troponin I - Now Then Q6H  Now then every 6 hours,   R     12/23/17 1842   Signed and Held  HIV antibody (Routine Testing)  Once,   R     Signed and Held   Signed and Held  Basic metabolic panel  Tomorrow morning,   R     Signed and Held   Signed and Held  Hemoglobin A1c  Tomorrow morning,   R     Signed and Held   Signed and Held  Lipid panel  Tomorrow morning,   R     Signed and Held          Vitals/Pain Today's Vitals   12/23/17 1730 12/23/17 1800 12/23/17 1830 12/23/17 2005  BP: 132/62 138/80 (!) 111/94 (!) 149/87  Pulse: (!) 101 (!) 104 (!) 106 92  Resp: (!) 6 (!) 36 (!) 28 (!) 35  Temp:  TempSrc:      SpO2: 99% 98% 100% 97%  Weight:      Height:      PainSc:        Isolation Precautions No active isolations  Medications Medications  amLODipine (NORVASC) tablet 5 mg (5 mg Oral Given 12/23/17 1805)  furosemide (LASIX) tablet 20 mg (20 mg Oral Given 12/23/17 1805)  furosemide (LASIX) tablet 20 mg (20 mg Oral Given 12/23/17 1918)    Mobility walks

## 2017-12-23 NOTE — ED Notes (Signed)
Ambulated patient via pulse Ox Patient maintained a steady gait without difficulty.Oxygen saturations were 95-98% with heart rate of 109-110. Patient able to speak in complete sentences without difficulty. Patient returned to room and placed back on monitor. Patient tolerated well

## 2017-12-23 NOTE — ED Notes (Signed)
Nitroglycerin paste removed from right upper arm.

## 2017-12-23 NOTE — ED Notes (Signed)
CRITICAL VALUE ALERT  Critical Value:  Trop 0.03   Date & Time Notied:  12/23/17 1627  Provider Notified: Estelle Grumbles
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
## 2017-12-23 NOTE — ED Notes (Signed)
Patient states he needs to go home and get some things together and would rather drive himself to [facility]; Dr Manus Gunning spoke with patient; advised of reasoning behind being admitted and transferred via [facility]; patient refusing to stay and would like to sign out and return to [facility]
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
## 2017-12-23 NOTE — ED Triage Notes (Signed)
Pt complains of shortness of breath and chest pain x 2 weeks. Pt states he was told he had acute heart failure. Pt went to [facility] and left AMA to take care of family issues.

## 2017-12-24 ENCOUNTER — Encounter (HOSPITAL_COMMUNITY): Payer: Self-pay | Admitting: Cardiology

## 2017-12-24 ENCOUNTER — Observation Stay (HOSPITAL_BASED_OUTPATIENT_CLINIC_OR_DEPARTMENT_OTHER): Payer: Medicaid Other

## 2017-12-24 DIAGNOSIS — E785 Hyperlipidemia, unspecified: Secondary | ICD-10-CM

## 2017-12-24 DIAGNOSIS — I10 Essential (primary) hypertension: Secondary | ICD-10-CM

## 2017-12-24 DIAGNOSIS — I429 Cardiomyopathy, unspecified: Secondary | ICD-10-CM

## 2017-12-24 DIAGNOSIS — I340 Nonrheumatic mitral (valve) insufficiency: Secondary | ICD-10-CM

## 2017-12-24 DIAGNOSIS — I5021 Acute systolic (congestive) heart failure: Secondary | ICD-10-CM

## 2017-12-24 LAB — BASIC METABOLIC PANEL
Anion gap: 10 (ref 5–15)
BUN: 8 mg/dL (ref 6–20)
CO2: 25 mmol/L (ref 22–32)
Calcium: 9.2 mg/dL (ref 8.9–10.3)
Chloride: 104 mmol/L (ref 98–111)
Creatinine, Ser: 1.29 mg/dL — ABNORMAL HIGH (ref 0.61–1.24)
GFR calc Af Amer: >60 mL/min (ref >60)
GFR calc non Af Amer: >60 mL/min (ref >60)
Glucose, Bld: 109 mg/dL — ABNORMAL HIGH (ref 70–99)
Potassium: 3.8 mmol/L (ref 3.5–5.1)
Sodium: 139 mmol/L (ref 135–145)

## 2017-12-24 LAB — ECHOCARDIOGRAM COMPLETE
Height: 70 in
Weight: 3992.97 oz

## 2017-12-24 LAB — LIPID PANEL
CHOLESTEROL: 245 mg/dL — AB (ref 0–200)
HDL: 28 mg/dL — ABNORMAL LOW (ref >40)
LDL CALC: 191 mg/dL — AB (ref 0–99)
Total CHOL/HDL Ratio: 8.8 RATIO
Triglycerides: 129 mg/dL (ref <150)
VLDL: 26 mg/dL (ref 0–40)

## 2017-12-24 LAB — HEMOGLOBIN A1C
Hgb A1c MFr Bld: 4.2 % — ABNORMAL LOW (ref 4.8–5.6)
MEAN PLASMA GLUCOSE: 73.84 mg/dL

## 2017-12-24 LAB — TROPONIN I: Troponin I: 0.03 ng/mL (ref <0.03)

## 2017-12-24 LAB — HIV ANTIBODY (ROUTINE TESTING W REFLEX): HIV Screen 4th Generation wRfx: NONREACTIVE

## 2017-12-24 MED ORDER — SACUBITRIL-VALSARTAN 24-26 MG PO TABS
1.0000 | ORAL_TABLET | Freq: Two times a day (BID) | ORAL | Status: DC
  Administered 2017-12-24 – 2017-12-25 (×3): 1 via ORAL
  Filled 2017-12-24 (×3): qty 1

## 2017-12-24 MED ORDER — FUROSEMIDE 40 MG PO TABS
40.0000 mg | ORAL_TABLET | Freq: Every day | ORAL | Status: DC
  Administered 2017-12-24 – 2017-12-25 (×2): 40 mg via ORAL
  Filled 2017-12-24 (×2): qty 1

## 2017-12-24 MED ORDER — ATORVASTATIN CALCIUM 40 MG PO TABS
40.0000 mg | ORAL_TABLET | Freq: Every day | ORAL | Status: DC
  Administered 2017-12-24: 40 mg via ORAL
  Filled 2017-12-24: qty 1

## 2017-12-24 MED ORDER — SPIRONOLACTONE 12.5 MG HALF TABLET
12.5000 mg | ORAL_TABLET | Freq: Every day | ORAL | Status: DC
  Administered 2017-12-24 – 2017-12-25 (×2): 12.5 mg via ORAL
  Filled 2017-12-24 (×2): qty 1

## 2017-12-24 MED ORDER — SALINE SPRAY 0.65 % NA SOLN
1.0000 | NASAL | Status: DC | PRN
  Administered 2017-12-24: 1 via NASAL
  Filled 2017-12-24: qty 44

## 2017-12-24 MED ORDER — CARVEDILOL 3.125 MG PO TABS
3.1250 mg | ORAL_TABLET | Freq: Two times a day (BID) | ORAL | Status: DC
  Administered 2017-12-24 – 2017-12-25 (×2): 3.125 mg via ORAL
  Filled 2017-12-24 (×2): qty 1

## 2017-12-24 NOTE — Progress Notes (Signed)
CRITICAL VALUE ALERT  Critical Value:  TROPONIN 0.03  Date & Time Notied:  [id number]  Provider Notified: YES  Orders Received/Actions taken: NO

## 2017-12-24 NOTE — Progress Notes (Addendum)
Patient ID: Stephen Ruiz, male   DOB: [birth year], 32 y.o.   MRN: [id number]  PROGRESS NOTE    Khameron Gruenwald  BMW:[id number] DOB: [birth year] DOA: 12/23/2017 PCP: Billee Cashing, MD   Brief Narrative:  32 year old male with no significant past medical history presented with worsening shortness of breath.  Chest x-ray showed vascular congestion.  He was started on intravenous Lasix and admitted for new onset CHF.   Assessment & Plan:   Active Problems:   Acute CHF (congestive heart failure) (HCC)   New onset acute CHF: Probably diastolic -Family history of early onset heart disease. -Currently on Lasix 40 mill grams IV every 12 hours.  Strict input and output.  Daily weights.  Fluid restriction.  Negative balance of 942 cc since admission -Consult dietitian -Consulted cardiology -2D echo pending  Hypertension -Blood pressure still elevated.  Continue Lasix for now.  Monitor  Hyperlipidemia -We will start Lipitor   DVT prophylaxis: Lovenox Code Status: Full Family Communication: Wife at bedside Disposition Plan: Home probably tomorrow on oral Lasix if cleared by cardiology  Consultants: Cardiology  Procedures: Echo pending  Antimicrobials: None   Subjective: Patient seen and examined at present.  He feels slightly better.  His abdominal swelling is getting better.  Shortness of breath is improving but still short of breath with some exertion.  No current chest pain.  Objective: Vitals:   12/23/17 2005 12/23/17 2143 12/24/17 0441 12/24/17 0809  BP: (!) 149/87 (!) 141/90 (!) 139/96 (!) 137/98  Pulse: 92 95 (!) 103 (!) 109  Resp: (!) 35 20 (!) 24 20  Temp:  98.3 F (36.8 C) 97.8 F (36.6 C) 98.3 F (36.8 C)  TempSrc:  Oral  Oral  SpO2: 97% 96% 98% 100%  Weight:  114.1 kg 113.2 kg   Height:  5\' 10"  (1.778 m)      Intake/Output Summary (Last 24 hours) at 12/24/2017 0904 Last data filed at 12/24/2017 0817 Gross per 24 hour  Intake 458 ml  Output  1400 ml  Net -942 ml   Filed Weights   12/23/17 1320 12/23/17 2143 12/24/17 0441  Weight: 117.9 kg 114.1 kg 113.2 kg    Examination:  General exam: Appears calm and comfortable  Respiratory system: Bilateral decreased breath sounds at bases with basilar crackles Cardiovascular system: S1 & S2 heard, Rate controlled Gastrointestinal system: Abdomen is nondistended, soft and nontender. Normal bowel sounds heard. Extremities: No cyanosis, clubbing; trace edema      Data Reviewed: I have personally reviewed following labs and imaging studies  CBC: Recent Labs  Lab 12/23/17 0022  WBC 8.8  NEUTROABS 4.4  HGB 14.7  HCT 48.1  MCV 91.1  PLT 285   Basic Metabolic Panel: Recent Labs  Lab 12/23/17 0022 12/24/17 0228  NA 135 139  K 3.5 3.8  CL 103 104  CO2 25 25  GLUCOSE 125* 109*  BUN 9 8  CREATININE 1.25* 1.29*  CALCIUM 9.2 9.2   GFR: Estimated Creatinine Clearance: 102.6 mL/min (A) (by C-G formula based on SCr of 1.29 mg/dL (H)). Liver Function Tests: No results for input(s): AST, ALT, ALKPHOS, BILITOT, PROT, ALBUMIN in the last 168 hours. No results for input(s): LIPASE, AMYLASE in the last 168 hours. No results for input(s): AMMONIA in the last 168 hours. Coagulation Profile: No results for input(s): INR, PROTIME in the last 168 hours. Cardiac Enzymes: Recent Labs  Lab 12/23/17 0022 12/23/17 1401 12/23/17 2000 12/24/17 0228  TROPONINI 0.03* 0.03* <0.03 0.03*   BNP (  last 3 results) No results for input(s): PROBNP in the last 8760 hours. HbA1C: Recent Labs    12/24/17 0228  HGBA1C 4.2*   CBG: No results for input(s): GLUCAP in the last 168 hours. Lipid Profile: Recent Labs    12/24/17 0228  CHOL 245*  HDL 28*  LDLCALC 191*  TRIG 129  CHOLHDL 8.8   Thyroid Function Tests: No results for input(s): TSH, T4TOTAL, FREET4, T3FREE, THYROIDAB in the last 72 hours. Anemia Panel: No results for input(s): VITAMINB12, FOLATE, FERRITIN, TIBC, IRON,  RETICCTPCT in the last 72 hours. Sepsis Labs: No results for input(s): PROCALCITON, LATICACIDVEN in the last 168 hours.  No results found for this or any previous visit (from the past 240 hour(s)).       Radiology Studies: Dg Chest 2 View  Result Date: 12/22/2017 CLINICAL DATA:  Shortness of breath EXAM: CHEST - 2 VIEW COMPARISON:  02/25/2013 FINDINGS: Mild cardiomegaly with central vascular congestion. No acute airspace disease or pleural effusion. No pneumothorax. IMPRESSION: Cardiomegaly with central vascular congestion. Electronically Signed   By: Jasmine Pang M.D.   On: 12/22/2017 21:49        Scheduled Meds: . enoxaparin (LOVENOX) injection  40 mg Subcutaneous Q24H  . furosemide  40 mg Intravenous Q12H   Continuous Infusions:   LOS: 0 days        Glade Lloyd, MD [facility] Pager [phone]  If 7PM-7AM, please contact night-coverage [email or web address] Password [email or web address] 12/24/2017, 9:04 AM
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
## 2017-12-24 NOTE — Progress Notes (Signed)
Pt arrived to unit room 1520 with spouse. Alert and oriented x4, 0/10 pain. VS taken and pt  Oriented to room and callbell with n o complications. Pt guide at the bedside. Initial assessment completed. Will continue to monitor

## 2017-12-24 NOTE — Progress Notes (Signed)
  Echocardiogram 2D Echocardiogram has been performed.  Stephen Ruiz 12/24/2017, 9:37 AM

## 2017-12-25 LAB — CBC WITH DIFFERENTIAL/PLATELET
Abs Immature Granulocytes: 0.03 10*3/uL (ref 0.00–0.07)
BASOS PCT: 1 %
Basophils Absolute: 0.1 10*3/uL (ref 0.0–0.1)
Eosinophils Absolute: 0.1 10*3/uL (ref 0.0–0.5)
Eosinophils Relative: 1 %
HEMATOCRIT: 60.6 % — AB (ref 39.0–52.0)
Hemoglobin: 18.6 g/dL — ABNORMAL HIGH (ref 13.0–17.0)
Immature Granulocytes: 0 %
Lymphocytes Relative: 34 %
Lymphs Abs: 2.7 10*3/uL (ref 0.7–4.0)
MCH: 27.4 pg (ref 26.0–34.0)
MCHC: 30.7 g/dL (ref 30.0–36.0)
MCV: 89.4 fL (ref 80.0–100.0)
Monocytes Absolute: 0.8 10*3/uL (ref 0.1–1.0)
Monocytes Relative: 9 %
Neutro Abs: 4.4 10*3/uL (ref 1.7–7.7)
Neutrophils Relative %: 55 %
Platelets: 377 10*3/uL (ref 150–400)
RBC: 6.78 MIL/uL — ABNORMAL HIGH (ref 4.22–5.81)
RDW: 13.9 % (ref 11.5–15.5)
WBC: 8.1 10*3/uL (ref 4.0–10.5)
nRBC: 0 % (ref 0.0–0.2)

## 2017-12-25 LAB — BASIC METABOLIC PANEL
Anion gap: 10 (ref 5–15)
BUN: 9 mg/dL (ref 6–20)
CO2: 24 mmol/L (ref 22–32)
Calcium: 9.4 mg/dL (ref 8.9–10.3)
Chloride: 104 mmol/L (ref 98–111)
Creatinine, Ser: 1.27 mg/dL — ABNORMAL HIGH (ref 0.61–1.24)
GFR calc Af Amer: >60 mL/min (ref >60)
GFR calc non Af Amer: >60 mL/min (ref >60)
Glucose, Bld: 120 mg/dL — ABNORMAL HIGH (ref 70–99)
Potassium: 4.1 mmol/L (ref 3.5–5.1)
Sodium: 138 mmol/L (ref 135–145)

## 2017-12-25 LAB — TSH: TSH: 0.696 u[IU]/mL (ref 0.350–4.500)

## 2017-12-25 LAB — MAGNESIUM: Magnesium: 2.3 mg/dL (ref 1.7–2.4)

## 2017-12-25 MED ORDER — SPIRONOLACTONE 25 MG PO TABS: 12.5000 mg | ORAL_TABLET | Freq: Every day | ORAL | 0 refills | Status: DC

## 2017-12-25 MED ORDER — ATORVASTATIN CALCIUM 40 MG PO TABS: 40.0000 mg | ORAL_TABLET | Freq: Every day | ORAL | 1 refills | Status: DC

## 2017-12-25 MED ORDER — HYDROCOD POLST-CPM POLST ER 10-8 MG/5ML PO SUER
5.0000 mL | Freq: Once | ORAL | Status: DC
  Filled 2017-12-25: qty 5

## 2017-12-25 MED ORDER — CARVEDILOL 6.25 MG PO TABS: 6.2500 mg | ORAL_TABLET | Freq: Two times a day (BID) | ORAL | 1 refills | Status: DC

## 2017-12-25 MED ORDER — DIPHENHYDRAMINE HCL 25 MG PO CAPS
25.0000 mg | ORAL_CAPSULE | ORAL | Status: DC | PRN
  Filled 2017-12-25: qty 1

## 2017-12-25 MED ORDER — CARVEDILOL 6.25 MG PO TABS: 6.2500 mg | ORAL_TABLET | Freq: Two times a day (BID) | ORAL | Status: DC

## 2017-12-25 MED ORDER — FUROSEMIDE 20 MG PO TABS: 20.0000 mg | ORAL_TABLET | Freq: Every day | ORAL | 1 refills | Status: DC

## 2017-12-25 MED ORDER — SACUBITRIL-VALSARTAN 24-26 MG PO TABS: 1.0000 | ORAL_TABLET | Freq: Two times a day (BID) | ORAL | 0 refills | Status: DC

## 2017-12-25 MED ORDER — FUROSEMIDE 20 MG PO TABS: 20.0000 mg | ORAL_TABLET | Freq: Every day | ORAL | Status: DC

## 2017-12-25 NOTE — Progress Notes (Addendum)
Progress Note  Patient Name: Stephen Ruiz Date of Encounter: 12/25/2017  Primary Cardiologist: Olga Millers, MD  Subjective   Feeling much better. Dyspnea resolved. Orthopnea resolved. He is now able to lay completely flat in bed. No exertional dyspnea. Denies CP. No palpitations.   Inpatient Medications    Scheduled Meds: . atorvastatin  40 mg Oral q1800  . carvedilol  3.125 mg Oral BID WC  . chlorpheniramine-HYDROcodone  5 mL Oral Once  . enoxaparin (LOVENOX) injection  40 mg Subcutaneous Q24H  . furosemide  40 mg Oral Daily  . sacubitril-valsartan  1 tablet Oral BID  . spironolactone  12.5 mg Oral Daily   Continuous Infusions:  PRN Meds: acetaminophen **OR** acetaminophen, diphenhydrAMINE, ondansetron **OR** ondansetron (ZOFRAN) IV, polyethylene glycol, sodium chloride   Vital Signs    Vitals:   12/24/17 1327 12/24/17 1340 12/24/17 2140 12/25/17 0449  BP: (!) 147/99  (!) 123/95 (!) 122/92  Pulse: (!) 113 (!) 110 85 96  Resp: 14  16 19   Temp: 98 F (36.7 C)  98.1 F (36.7 C) 98.3 F (36.8 C)  TempSrc: Oral     SpO2: 98%  99% 98%  Weight:    113.2 kg  Height:        Intake/Output Summary (Last 24 hours) at 12/25/2017 0932 Last data filed at 12/25/2017 4098 Gross per 24 hour  Intake 576 ml  Output 2075 ml  Net -1499 ml   Filed Weights   12/23/17 2143 12/24/17 0441 12/25/17 0449  Weight: 114.1 kg 113.2 kg 113.2 kg    Telemetry    NSR, no arrthymias - Personally Reviewed  ECG    Not performed today  - Personally Reviewed  Physical Exam   GEN: muscular, fit appearing young AAM in No acute distress.   Neck: No JVD Cardiac: RRR, no murmurs, rubs, or gallops.  Respiratory: Clear to auscultation bilaterally. GI: Soft, nontender, non-distended  MS: No edema; No deformity. Neuro:  Nonfocal  Psych: Normal affect   Labs    Chemistry Recent Labs  Lab 12/23/17 0022 12/24/17 0228 12/25/17 0625  NA 135 139 138  K 3.5 3.8 4.1  CL 103  104 104  CO2 25 25 24   GLUCOSE 125* 109* 120*  BUN 9 8 9   CREATININE 1.25* 1.29* 1.27*  CALCIUM 9.2 9.2 9.4  GFRNONAA >60 >60 >60  GFRAA >60 >60 >60  ANIONGAP 7 10 10      Hematology Recent Labs  Lab 12/23/17 0022 12/25/17 0625  WBC 8.8 8.1  RBC 5.28 6.78*  HGB 14.7 18.6*  HCT 48.1 60.6*  MCV 91.1 89.4  MCH 27.8 27.4  MCHC 30.6 30.7  RDW 13.2 13.9  PLT 285 377    Cardiac Enzymes Recent Labs  Lab 12/23/17 0022 12/23/17 1401 12/23/17 2000 12/24/17 0228  TROPONINI 0.03* 0.03* <0.03 0.03*   No results for input(s): TROPIPOC in the last 168 hours.   BNP Recent Labs  Lab 12/23/17 0022  BNP 236.1*     DDimer  Recent Labs  Lab 12/23/17 0022  DDIMER 0.33     Radiology    No results found.  Cardiac Studies   2D Echo 12/24/17 Study Conclusions  - Left ventricle: The cavity size was moderately dilated. Systolic   function was severely reduced. The estimated ejection fraction   was in the range of 20% to 25%. Diffuse hypokinesis. Features are   consistent with a pseudonormal left ventricular filling pattern,   with concomitant abnormal relaxation and increased  filling   pressure (grade 2 diastolic dysfunction). GLS: -9.2% - Mitral valve: Calcified annulus. Mildly thickened leaflets .   There was mild regurgitation. - Left atrium: The atrium was mildly dilated. - Right ventricle: The cavity size was mildly dilated. Wall   thickness was normal.   Patient Profile     32 y.o. male w/ h/o HTN and HLD, being seen today for acute systolic CHF.   Assessment & Plan    1. Acute Systolic CHF: continues to respond well to diuretics with an additional 2L out in UOP yesterday. Net I/Os negative 2.4 L. Dyspnea and orthopnea completely resolved. He feels back to baseline. Renal function and electrolytes are stable. Continue to monitor. Keep BP controlled. Low sodium diet. We discussed daily weights once he returns home. Given he is on Entresto, he may be able to take  Lasix only PRN. Would recommend lasix if > 3 lb weight gain in 24 hrs or > 5lb in the course of 1 week.   2. Cardiomyopathy: new diagnosis. EF is 20-25%. Prior echo in 2010 showed normal EF. Given his age, suspect NICM, likely 2/2 HTN. Tele reviewed. No arrhthymias. Guidelines directed medical therapy for systolic HF has been initiated. He is on Entresto, spironolactone and Coreg. He will need further titration of HF meds as BP and renal function allows. He will need repeat echo after 3 months of optimal medical therapy. If EF remains <35%, he will need referral to EP for ICD.   3. HTN: BP was elevated initially but better after initiation of HF meds/ antihypertensives. BP this morning was 122/92. Continue Entresto, spironolactone, Coreg and Lasix. He will need gradual titration of HF meds given systolic HF. Would try to optimize Entresto first. This can be done as outpatient.   4. HLD: LDL elevated at 191. He has been started on a statin, Lipitor 40 mg. Plan to repeat FLP and HFTs in 4 weeks.   Dispo: nearing d/c from a cardiac standpoint. Will defer timing to Dr. Jens Som. Pt and wife also asking about return to work status. He works as a Administrator. Will defer to Dr. Jens Som.    For questions or updates, please contact [facility] Please consult [email or web address] for contact info under        Signed, Robbie Lis, PA-C  12/25/2017, 9:32 AM   As above, patient seen and examined.  He is markedly improved compared to admission.  There is no dyspnea on exertion, orthopnea, PND, pedal edema or chest pain.  Cardiomyopathy felt likely secondary to hypertension.  Plan to continue Entresto at present dose and I will increase carvedilol to 6.25 mg twice daily.  Continue spironolactone.  Decrease Lasix to 20 mg daily.  He will take an additional 20 mg for worsening dyspnea or weight gain of 2 pounds.  We discussed the importance of fluid restriction and low-sodium diet.  I will arrange transition of  care appointment with APP in 1 week.  Check potassium and renal function at that time.  Advance Entresto and carvedilol as tolerated by pulse and blood pressure.  If renal function stable at that time we will arrange a CTA to exclude coronary disease as a cause of his cardiomyopathy.  Plan repeat echocardiogram 3 months after medications titrated to see if LV function improved.  If ejection fraction less than 35% would require cardiac MRI and ICD. Elevated Hgb per IM.  Patient can be discharged from a cardiac standpoint.  [facility] will sign off.   Medication  Recommendations: Meds as listed in [facility] Other recommendations (labs, testing, etc): We will arrange cardiac CTA when he returns for follow-up if renal function stable. Follow up as an outpatient: Transition of care appointment with APP 1 week.  Follow-up with me 8 weeks.  Olga Millers, MD
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
## 2017-12-25 NOTE — Care Management Note (Signed)
Case Management Note  Patient Details  Name: Stephen Ruiz MRN: [id number] Date of Birth: Jun 08, 1985  Subjective/Objective:                  discharged  Action/Plan: Discharged to home with self-care, orders checked for hhc needs. No CM needs present at time of discharge.  Patient is able to arrangement own appointments and home care.  Expected Discharge Date:  12/25/17               Expected Discharge Plan:  Home/Self Care  In-House Referral:     Discharge planning Services  CM Consult  Post Acute Care Choice:    Choice offered to:     DME Arranged:    DME Agency:     HH Arranged:    HH Agency:     Status of Service:  Completed, signed off  If discussed at [employer] of Stay Meetings, dates discussed:    Additional Comments:  Golda Acre, RN 12/25/2017, 11:14 AM

## 2018-04-13 ENCOUNTER — Encounter: Payer: Self-pay | Admitting: Cardiology

## 2018-04-13 ENCOUNTER — Telehealth (INDEPENDENT_AMBULATORY_CARE_PROVIDER_SITE_OTHER): Payer: Medicaid Other | Admitting: Cardiology

## 2018-04-13 ENCOUNTER — Telehealth: Payer: Self-pay | Admitting: Cardiology

## 2018-04-13 VITALS — Ht 70.0 in | Wt 253.0 lb

## 2018-04-13 DIAGNOSIS — I5041 Acute combined systolic (congestive) and diastolic (congestive) heart failure: Secondary | ICD-10-CM

## 2018-04-13 DIAGNOSIS — I110 Hypertensive heart disease with heart failure: Secondary | ICD-10-CM

## 2018-04-13 DIAGNOSIS — E785 Hyperlipidemia, unspecified: Secondary | ICD-10-CM | POA: Insufficient documentation

## 2018-04-13 DIAGNOSIS — I10 Essential (primary) hypertension: Secondary | ICD-10-CM | POA: Insufficient documentation

## 2018-04-13 DIAGNOSIS — I428 Other cardiomyopathies: Secondary | ICD-10-CM | POA: Insufficient documentation

## 2018-04-13 MED ORDER — CARVEDILOL 6.25 MG PO TABS: 6.2500 mg | ORAL_TABLET | Freq: Two times a day (BID) | ORAL | 6 refills | Status: DC

## 2018-04-13 MED ORDER — ATORVASTATIN CALCIUM 40 MG PO TABS: 40.0000 mg | ORAL_TABLET | Freq: Every day | ORAL | 3 refills | Status: DC

## 2018-04-13 MED ORDER — IRBESARTAN 150 MG PO TABS: 150.0000 mg | ORAL_TABLET | Freq: Every day | ORAL | 2 refills | Status: DC

## 2018-04-13 MED ORDER — FUROSEMIDE 20 MG PO TABS: 20.0000 mg | ORAL_TABLET | Freq: Every day | ORAL | 6 refills | Status: DC

## 2018-04-13 MED ORDER — SPIRONOLACTONE 25 MG PO TABS: 12.5000 mg | ORAL_TABLET | Freq: Every day | ORAL | 6 refills | Status: DC

## 2018-04-13 MED ORDER — SACUBITRIL-VALSARTAN 24-26 MG PO TABS: 1.0000 | ORAL_TABLET | Freq: Two times a day (BID) | ORAL | 6 refills | Status: DC
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
## 2018-04-13 NOTE — Patient Instructions (Addendum)
Medication Instructions:  STOP Entresto START Irbesartan 150mg  Take 1 tablet once a day If you need a refill on your cardiac medications before your next appointment, please call your pharmacy.   Lab work: None  If you have labs (blood work) drawn today and your tests are completely normal, you will receive your results only by: Marland Kitchen MyChart Message (if you have MyChart) OR . A paper copy in the mail If you have any lab test that is abnormal or we need to change your treatment, we will call you to review the results.  Testing/Procedures: None   Follow-Up: At [facility], you and your health needs are our priority.  As part of our continuing mission to provide you with exceptional heart care, we have created designated Provider Care Teams.  These Care Teams include your primary Cardiologist (physician) and Advanced Practice Providers (APPs -  Physician Assistants and Nurse Practitioners) who all work together to provide you with the care you need, when you need it.  . Your physician recommends that you schedule a follow-up appointment in: 6 weeks with Corine Shelter, PA-C  Any Other Special Instructions Will Be Listed Below (If Applicable).

## 2018-04-13 NOTE — Telephone Encounter (Signed)
Spoke with pt, he reports he has been waiting for a follow up appointment since discharge from the hospital 12/19. He is currently out of all his medications. Refills sent to pharmacy electronically. Patient will have an e visit with luke kilroy pa-c at 2 pm today. Patient aware to download the webex app to his smart phone.

## 2018-04-13 NOTE — Telephone Encounter (Signed)
New Message:    Please call, pt's wife is calling. She said pt was discharged in December from [facility] and was supposed to have a follow up visit, Pt have not been seen yet. Pt is still having problems with high blood pressure. This morning it was 176/117 90.  l

## 2018-05-18 ENCOUNTER — Telehealth: Payer: Self-pay | Admitting: Cardiology

## 2018-05-18 NOTE — Telephone Encounter (Signed)
Called patient to reschedule appointment, but, his voicemail is not set up yet.

## 2018-05-25 ENCOUNTER — Ambulatory Visit: Payer: Self-pay | Admitting: Cardiology

## 2018-05-26 ENCOUNTER — Encounter: Payer: Self-pay | Admitting: Cardiology

## 2018-05-26 ENCOUNTER — Telehealth (INDEPENDENT_AMBULATORY_CARE_PROVIDER_SITE_OTHER): Payer: Self-pay | Admitting: Cardiology

## 2018-05-26 ENCOUNTER — Telehealth: Payer: Self-pay

## 2018-05-26 ENCOUNTER — Telehealth: Payer: Self-pay | Admitting: *Deleted

## 2018-05-26 VITALS — Ht 70.0 in | Wt 250.0 lb

## 2018-05-26 DIAGNOSIS — I10 Essential (primary) hypertension: Secondary | ICD-10-CM

## 2018-05-26 DIAGNOSIS — I428 Other cardiomyopathies: Secondary | ICD-10-CM

## 2018-05-26 DIAGNOSIS — E785 Hyperlipidemia, unspecified: Secondary | ICD-10-CM

## 2018-05-26 NOTE — Telephone Encounter (Signed)
Virtual Visit Pre-Appointment Phone Call  "(Name), I am calling you today to discuss your upcoming appointment. We are currently trying to limit exposure to the virus that causes COVID-19 by seeing patients at home rather than in the office."  1. "What is the BEST phone Ruiz to call the day of the visit?" - include this in appointment notes  2. "Do you have or have access to (through a family member/friend) a smartphone with video capability that we can use for your visit?" a. If yes - list this Ruiz in appt notes as "cell" (if different from BEST phone #) and list the appointment type as a VIDEO visit in appointment notes b. If no - list the appointment type as a PHONE visit in appointment notes  3. Confirm consent - "In the setting of the current Covid19 crisis, you are scheduled for a (phone or video) visit with your provider on (date) at (time).  Just as we do with many in-office visits, in order for you to participate in this visit, we must obtain consent.  If you'd like, I can send this to your mychart (if signed up) or email for you to review.  Otherwise, I can obtain your verbal consent now.  All virtual visits are billed to your insurance company just like a normal visit would be.  By agreeing to a virtual visit, we'd like you to understand that the technology does not allow for your provider to perform an examination, and thus may limit your provider's ability to fully assess your condition. If your provider identifies any concerns that need to be evaluated in person, we will make arrangements to do so.  Finally, though the technology is pretty good, we cannot assure that it will always work on either your or our end, and in the setting of a video visit, we may have to convert it to a phone-only visit.  In either situation, we cannot ensure that we have a secure connection.  Are you willing to proceed?" STAFF: Did the patient verbally acknowledge consent to telehealth visit? Document  YES/NO here: YES  4. Advise patient to be prepared - "Two hours prior to your appointment, go ahead and check your blood pressure, pulse, oxygen saturation, and your weight (if you have the equipment to check those) and write them all down. When your visit starts, your provider will ask you for this information. If you have an Apple Watch or Kardia device, please plan to have heart rate information ready on the day of your appointment. Please have a pen and paper handy nearby the day of the visit as well."  5. Give patient instructions for MyChart download to smartphone OR Doximity/Doxy.me as below if video visit (depending on what platform provider is using)  6. Inform patient they will receive a phone call 15 minutes prior to their appointment time (may be from unknown caller ID) so they should be prepared to answer    TELEPHONE CALL NOTE  Stephen Ruiz has been deemed a candidate for a follow-up tele-health visit to limit community exposure during the Covid-19 pandemic. I spoke with the patient via phone to ensure availability of phone/video source, confirm preferred email & phone Ruiz, and discuss instructions and expectations.  I reminded Stephen Ruiz to be prepared with any vital sign and/or heart rhythm information that could potentially be obtained via home monitoring, at the time of his visit. I reminded Stephen Ruiz to expect a phone call prior to his visit.  Stephen Ruiz, CMA 05/26/2018 3:31 PM   INSTRUCTIONS FOR DOWNLOADING THE MYCHART APP TO SMARTPHONE  - The patient must first make sure to have activated MyChart and know their login information - If Apple, go to [employer] and type in MyChart in the search bar and download the app. If Android, ask patient to go to [employer] and type in [place] in the search bar and download the app. The app is free but as with any other app downloads, their phone may require them to verify saved payment  information or Apple/Android password.  - The patient will need to then log into the app with their MyChart username and password, and select [facility] as their healthcare provider to link the account. When it is time for your visit, go to the MyChart app, find appointments, and click Begin Video Visit. Be sure to Select Allow for your device to access the Microphone and Camera for your visit. You will then be connected, and your provider will be with you shortly.  **If they have any issues connecting, or need assistance please contact MyChart service desk [phone])**  **If using a computer, in order to ensure the best quality for their visit they will need to use either of the following Internet Browsers: [employer], or Google Chrome**  IF USING DOXIMITY or DOXY.ME - The patient will receive a link just prior to their visit by text.     FULL LENGTH CONSENT FOR TELE-HEALTH VISIT   I hereby voluntarily request, consent and authorize [facility] and its employed or contracted physicians, physician assistants, nurse practitioners or other licensed health care professionals (the Practitioner), to provide me with telemedicine health care services (the "Services") as deemed necessary by the treating Practitioner. I acknowledge and consent to receive the Services by the Practitioner via telemedicine. I understand that the telemedicine visit will involve communicating with the Practitioner through live audiovisual communication technology and the disclosure of certain medical information by electronic transmission. I acknowledge that I have been given the opportunity to request an in-person assessment or other available alternative prior to the telemedicine visit and am voluntarily participating in the telemedicine visit.  I understand that I have the right to withhold or withdraw my consent to the use of telemedicine in the course of my care at any time, without affecting my right  to future care or treatment, and that the Practitioner or I may terminate the telemedicine visit at any time. I understand that I have the right to inspect all information obtained and/or recorded in the course of the telemedicine visit and may receive copies of available information for a reasonable fee.  I understand that some of the potential risks of receiving the Services via telemedicine include:  Marland Kitchen Delay or interruption in medical evaluation due to technological equipment failure or disruption; . Information transmitted may not be sufficient (e.g. poor resolution of images) to allow for appropriate medical decision making by the Practitioner; and/or  . In rare instances, security protocols could fail, causing a breach of personal health information.  Furthermore, I acknowledge that it is my responsibility to provide information about my medical history, conditions and care that is complete and accurate to the best of my ability. I acknowledge that Practitioner's advice, recommendations, and/or decision may be based on factors not within their control, such as incomplete or inaccurate data provided by me or distortions of diagnostic images or specimens that may result from electronic transmissions. I understand that  the practice of medicine is not an exact science and that Practitioner makes no warranties or guarantees regarding treatment outcomes. I acknowledge that I will receive a copy of this consent concurrently upon execution via email to the email address I last provided but may also request a printed copy by calling the office of [facility].    I understand that my insurance will be billed for this visit.   I have read or had this consent read to me. . I understand the contents of this consent, which adequately explains the benefits and risks of the Services being provided via telemedicine.  . I have been provided ample opportunity to ask questions regarding this consent and the Services  and have had my questions answered to my satisfaction. . I give my informed consent for the services to be provided through the use of telemedicine in my medical care  By participating in this telemedicine visit I agree to the above.
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
## 2018-05-26 NOTE — Telephone Encounter (Signed)
Contacted patient to discuss AVS instructions. Patient agreeable with Luke recommendations and voiced understanding. 

## 2018-05-26 NOTE — Telephone Encounter (Signed)
Spoke with patient and he stated he was on his way home. Sent text message for video visit. 4:30 pm still no patient in waiting area and [facility] contacted pt but no answer. 4:31 pm pt is in waiting area for virtual video visit.

## 2018-05-26 NOTE — Telephone Encounter (Signed)
Virtual Visit Pre-Appointment Phone Call  "(Name), I am calling you today to discuss your upcoming appointment. We are currently trying to limit exposure to the virus that causes COVID-19 by seeing patients at home rather than in the office."  1. "What is the BEST phone number to call the day of the visit?" - include this in appointment notes  2. "Do you have or have access to (through a family member/friend) a smartphone with video capability that we can use for your visit?" a. If yes - list this number in appt notes as "cell" (if different from BEST phone #) and list the appointment type as a VIDEO visit in appointment notes b. If no - list the appointment type as a PHONE visit in appointment notes  3. Confirm consent - "In the setting of the current Covid19 crisis, you are scheduled for a (phone or video) visit with your provider on (date) at (time).  Just as we do with many in-office visits, in order for you to participate in this visit, we must obtain consent.  If you'd like, I can send this to your mychart (if signed up) or email for you to review.  Otherwise, I can obtain your verbal consent now.  All virtual visits are billed to your insurance company just like a normal visit would be.  By agreeing to a virtual visit, we'd like you to understand that the technology does not allow for your provider to perform an examination, and thus may limit your provider's ability to fully assess your condition. If your provider identifies any concerns that need to be evaluated in person, we will make arrangements to do so.  Finally, though the technology is pretty good, we cannot assure that it will always work on either your or our end, and in the setting of a video visit, we may have to convert it to a phone-only visit.  In either situation, we cannot ensure that we have a secure connection.  Are you willing to proceed?" STAFF: Did the patient verbally acknowledge consent to telehealth visit? Document  YES/NO here: YES  4. Advise patient to be prepared - "Two hours prior to your appointment, go ahead and check your blood pressure, pulse, oxygen saturation, and your weight (if you have the equipment to check those) and write them all down. When your visit starts, your provider will ask you for this information. If you have an Apple Watch or Kardia device, please plan to have heart rate information ready on the day of your appointment. Please have a pen and paper handy nearby the day of the visit as well."  5. Give patient instructions for MyChart download to smartphone OR Doximity/Doxy.me as below if video visit (depending on what platform provider is using)  6. Inform patient they will receive a phone call 15 minutes prior to their appointment time (may be from unknown caller ID) so they should be prepared to answer    TELEPHONE CALL NOTE  Stephen Ruiz has been deemed a candidate for a follow-up tele-health visit to limit community exposure during the Covid-19 pandemic. I spoke with the patient via phone to ensure availability of phone/video source, confirm preferred email & phone number, and discuss instructions and expectations.  I reminded Stephen Ruiz to be prepared with any vital sign and/or heart rhythm information that could potentially be obtained via home monitoring, at the time of his visit. I reminded Stephen Ruiz to expect a phone call prior to his visit.  [name], CMA 05/26/2018 3:19 PM   INSTRUCTIONS FOR DOWNLOADING THE MYCHART APP TO SMARTPHONE  - The patient must first make sure to have activated MyChart and know their login information - If Apple, go to [employer] and type in MyChart in the search bar and download the app. If Android, ask patient to go to [employer] and type in [place] in the search bar and download the app. The app is free but as with any other app downloads, their phone may require them to verify saved payment  information or Apple/Android password.  - The patient will need to then log into the app with their MyChart username and password, and select [facility] as their healthcare provider to link the account. When it is time for your visit, go to the MyChart app, find appointments, and click Begin Video Visit. Be sure to Select Allow for your device to access the Microphone and Camera for your visit. You will then be connected, and your provider will be with you shortly.  **If they have any issues connecting, or need assistance please contact MyChart service desk [phone])**  **If using a computer, in order to ensure the best quality for their visit they will need to use either of the following Internet Browsers: [employer], or Google Chrome**  IF USING DOXIMITY or DOXY.ME - The patient will receive a link just prior to their visit by text.     FULL LENGTH CONSENT FOR TELE-HEALTH VISIT   I hereby voluntarily request, consent and authorize [facility] and its employed or contracted physicians, physician assistants, nurse practitioners or other licensed health care professionals (the Practitioner), to provide me with telemedicine health care services (the "Services") as deemed necessary by the treating Practitioner. I acknowledge and consent to receive the Services by the Practitioner via telemedicine. I understand that the telemedicine visit will involve communicating with the Practitioner through live audiovisual communication technology and the disclosure of certain medical information by electronic transmission. I acknowledge that I have been given the opportunity to request an in-person assessment or other available alternative prior to the telemedicine visit and am voluntarily participating in the telemedicine visit.  I understand that I have the right to withhold or withdraw my consent to the use of telemedicine in the course of my care at any time, without affecting my right  to future care or treatment, and that the Practitioner or I may terminate the telemedicine visit at any time. I understand that I have the right to inspect all information obtained and/or recorded in the course of the telemedicine visit and may receive copies of available information for a reasonable fee.  I understand that some of the potential risks of receiving the Services via telemedicine include:  Marland Kitchen Delay or interruption in medical evaluation due to technological equipment failure or disruption; . Information transmitted may not be sufficient (e.g. poor resolution of images) to allow for appropriate medical decision making by the Practitioner; and/or  . In rare instances, security protocols could fail, causing a breach of personal health information.  Furthermore, I acknowledge that it is my responsibility to provide information about my medical history, conditions and care that is complete and accurate to the best of my ability. I acknowledge that Practitioner's advice, recommendations, and/or decision may be based on factors not within their control, such as incomplete or inaccurate data provided by me or distortions of diagnostic images or specimens that may result from electronic transmissions. I understand that  the practice of medicine is not an exact science and that Practitioner makes no warranties or guarantees regarding treatment outcomes. I acknowledge that I will receive a copy of this consent concurrently upon execution via email to the email address I last provided but may also request a printed copy by calling the office of [facility].    I understand that my insurance will be billed for this visit.   I have read or had this consent read to me. . I understand the contents of this consent, which adequately explains the benefits and risks of the Services being provided via telemedicine.  . I have been provided ample opportunity to ask questions regarding this consent and the Services  and have had my questions answered to my satisfaction. . I give my informed consent for the services to be provided through the use of telemedicine in my medical care  By participating in this telemedicine visit I agree to the above.

## 2018-05-26 NOTE — Patient Instructions (Signed)
Medication Instructions:  Your physician recommends that you continue on your current medications as directed. Please refer to the Current Medication list given to you today. If you need a refill on your cardiac medications before your next appointment, please call your pharmacy.   Lab work: Your physician recommends that you return for lab work in: BMET Monday (06/01/2018) OR Tuesday (06/02/2018) If you have labs (blood work) drawn today and your tests are completely normal, you will receive your results only by: Marland Kitchen MyChart Message (if you have MyChart) OR . A paper copy in the mail If you have any lab test that is abnormal or we need to change your treatment, we will call you to review the results.  Testing/Procedures: NONE   Follow-Up: At [facility], you and your health needs are our priority.  As part of our continuing mission to provide you with exceptional heart care, we have created designated Provider Care Teams.  These Care Teams include your primary Cardiologist (physician) and Advanced Practice Providers (APPs -  Physician Assistants and Nurse Practitioners) who all work together to provide you with the care you need, when you need it. . Your physician recommends that you schedule a follow-up appointment in: 2 WEEKS WITH Corine Shelter, PA-C  Any Other Special Instructions Will Be Listed Below (If Applicable).

## 2018-06-05 ENCOUNTER — Telehealth: Payer: Self-pay | Admitting: Cardiology

## 2018-06-05 NOTE — Telephone Encounter (Signed)
Mychart pending, smartphone, consent (verbal), pre reg complete 06/05/18 AF

## 2018-06-09 ENCOUNTER — Telehealth: Payer: Self-pay | Admitting: Cardiology

## 2018-06-09 ENCOUNTER — Telehealth: Payer: Self-pay

## 2018-06-09 NOTE — Telephone Encounter (Signed)
Contacted patient to get vitals and go over medication list.  Spoke with pt he stated that he was at work and could not attend the video visit and wanted to be rescheduled for a later time.   I rescheduled patient for august.

## 2018-06-10 NOTE — Progress Notes (Signed)
Pt asked to reschedule as he was doing well.  Corine Shelter PA-C 06/10/2018 8:58 AM

## 2018-08-12 ENCOUNTER — Telehealth: Payer: Self-pay | Admitting: *Deleted

## 2018-08-12 NOTE — Telephone Encounter (Signed)
Unable to leave a message, no voicemail.  

## 2018-09-06 ENCOUNTER — Emergency Department (HOSPITAL_BASED_OUTPATIENT_CLINIC_OR_DEPARTMENT_OTHER): Payer: Medicaid Other

## 2018-09-06 ENCOUNTER — Emergency Department (HOSPITAL_BASED_OUTPATIENT_CLINIC_OR_DEPARTMENT_OTHER)
Admission: EM | Admit: 2018-09-06 | Discharge: 2018-09-06 | Disposition: A | Discharge status: Home / Self Care | Payer: Medicaid Other | Attending: Emergency Medicine | Admitting: Emergency Medicine

## 2018-09-06 ENCOUNTER — Encounter (HOSPITAL_BASED_OUTPATIENT_CLINIC_OR_DEPARTMENT_OTHER): Payer: Self-pay | Admitting: Emergency Medicine

## 2018-09-06 ENCOUNTER — Other Ambulatory Visit: Payer: Self-pay

## 2018-09-06 DIAGNOSIS — M25511 Pain in right shoulder: Secondary | ICD-10-CM | POA: Insufficient documentation

## 2018-09-06 DIAGNOSIS — Z79899 Other long term (current) drug therapy: Secondary | ICD-10-CM | POA: Insufficient documentation

## 2018-09-06 DIAGNOSIS — I110 Hypertensive heart disease with heart failure: Secondary | ICD-10-CM | POA: Insufficient documentation

## 2018-09-06 DIAGNOSIS — F17200 Nicotine dependence, unspecified, uncomplicated: Secondary | ICD-10-CM | POA: Insufficient documentation

## 2018-09-06 DIAGNOSIS — I509 Heart failure, unspecified: Secondary | ICD-10-CM | POA: Insufficient documentation

## 2018-09-06 HISTORY — DX: Essential (primary) hypertension: I10

## 2018-09-06 MED ORDER — HYDROCODONE-ACETAMINOPHEN 5-325 MG PO TABS
1.0000 | ORAL_TABLET | Freq: Once | ORAL | Status: AC
  Administered 2018-09-06: 1 via ORAL
  Filled 2018-09-06: qty 1

## 2018-09-06 MED ORDER — HYDROCODONE-ACETAMINOPHEN 5-325 MG PO TABS: 1.0000 | ORAL_TABLET | Freq: Four times a day (QID) | ORAL | 0 refills | Status: DC | PRN

## 2018-09-06 NOTE — ED Notes (Signed)
ED Provider at bedside. 

## 2018-09-06 NOTE — ED Notes (Signed)
PMS intact before and after. Pt tolerated well. All questions answered. 

## 2018-09-06 NOTE — ED Notes (Signed)
Pt d/c home with ride. Verbalized understanding to pick up Rx at pharmacy listed on d/c paperwork

## 2018-09-06 NOTE — ED Triage Notes (Signed)
1400 took ibuprofen and 2 robaxin without relief
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
## 2018-09-06 NOTE — ED Triage Notes (Signed)
Pt states that his right shoulder is bothering him. Has history of similar issue. Not sure if he has injured it while working out. 1400 took ibuprofen and

## 2018-09-06 NOTE — Discharge Instructions (Addendum)
Use the shoulder immobilizer sling as much as possible.  Take the pain medicine as needed.  Continue taking the Motrin.  Make an appointment to follow-up with sports medicine.  X-rays today showed no dislocation no bony problems.

## 2018-09-08 ENCOUNTER — Encounter: Payer: Self-pay | Admitting: Family Medicine

## 2018-09-08 ENCOUNTER — Other Ambulatory Visit: Payer: Self-pay

## 2018-09-08 ENCOUNTER — Ambulatory Visit (INDEPENDENT_AMBULATORY_CARE_PROVIDER_SITE_OTHER): Payer: Self-pay | Admitting: Family Medicine

## 2018-09-08 ENCOUNTER — Ambulatory Visit: Payer: Self-pay

## 2018-09-08 VITALS — BP 130/80 | Ht 67.0 in | Wt 237.0 lb

## 2018-09-08 DIAGNOSIS — M25511 Pain in right shoulder: Secondary | ICD-10-CM

## 2018-09-08 MED ORDER — PREDNISONE 5 MG PO TABS: ORAL_TABLET | ORAL | 0 refills | Status: DC

## 2018-09-08 MED ORDER — CYCLOBENZAPRINE HCL 10 MG PO TABS: ORAL_TABLET | ORAL | 0 refills | Status: DC
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
## 2018-09-08 NOTE — Patient Instructions (Signed)
Nice to meet you Please try the prednisone  Please use the duexis after that.  Please try the muscle relaxer  Please try the sling if it helps  Please send me a message in MyChart with any questions or updates.  Please see me back in 2-3 weeks.   --Dr. Raeford Razor

## 2018-09-08 NOTE — Assessment & Plan Note (Signed)
Symptoms seem to be more capsular related. Possible subluxation based on his history. Has normal ROM.  - prednisone  - flexeril  - provided duexis samples  - counseled on supportive care - counseled on sling use.

## 2018-09-23 ENCOUNTER — Ambulatory Visit: Payer: Self-pay | Admitting: Family Medicine
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
## 2018-10-06 ENCOUNTER — Other Ambulatory Visit: Payer: Self-pay

## 2018-10-06 ENCOUNTER — Encounter (HOSPITAL_BASED_OUTPATIENT_CLINIC_OR_DEPARTMENT_OTHER): Payer: Self-pay | Admitting: *Deleted

## 2018-10-06 ENCOUNTER — Emergency Department (HOSPITAL_BASED_OUTPATIENT_CLINIC_OR_DEPARTMENT_OTHER)
Admission: EM | Admit: 2018-10-06 | Discharge: 2018-10-06 | Disposition: A | Discharge status: Home / Self Care | Payer: Medicaid Other | Attending: Emergency Medicine | Admitting: Emergency Medicine

## 2018-10-06 ENCOUNTER — Emergency Department (HOSPITAL_BASED_OUTPATIENT_CLINIC_OR_DEPARTMENT_OTHER): Payer: Medicaid Other

## 2018-10-06 DIAGNOSIS — Z79899 Other long term (current) drug therapy: Secondary | ICD-10-CM | POA: Insufficient documentation

## 2018-10-06 DIAGNOSIS — J40 Bronchitis, not specified as acute or chronic: Secondary | ICD-10-CM | POA: Insufficient documentation

## 2018-10-06 DIAGNOSIS — I10 Essential (primary) hypertension: Secondary | ICD-10-CM | POA: Insufficient documentation

## 2018-10-06 DIAGNOSIS — F17290 Nicotine dependence, other tobacco product, uncomplicated: Secondary | ICD-10-CM | POA: Insufficient documentation

## 2018-10-06 LAB — CBC
HCT: 47 % (ref 39.0–52.0)
Hemoglobin: 15 g/dL (ref 13.0–17.0)
MCH: 28.7 pg (ref 26.0–34.0)
MCHC: 31.9 g/dL (ref 30.0–36.0)
MCV: 90 fL (ref 80.0–100.0)
Platelets: 209 10*3/uL (ref 150–400)
RBC: 5.22 MIL/uL (ref 4.22–5.81)
RDW: 13.3 % (ref 11.5–15.5)
WBC: 10.5 10*3/uL (ref 4.0–10.5)
nRBC: 0 % (ref 0.0–0.2)

## 2018-10-06 LAB — BASIC METABOLIC PANEL
Anion gap: 8 (ref 5–15)
BUN: 8 mg/dL (ref 6–20)
CO2: 24 mmol/L (ref 22–32)
Calcium: 8.2 mg/dL — ABNORMAL LOW (ref 8.9–10.3)
Chloride: 106 mmol/L (ref 98–111)
Creatinine, Ser: 1.02 mg/dL (ref 0.61–1.24)
GFR calc Af Amer: >60 mL/min (ref >60)
GFR calc non Af Amer: >60 mL/min (ref >60)
Glucose, Bld: 89 mg/dL (ref 70–99)
Potassium: 3.2 mmol/L — ABNORMAL LOW (ref 3.5–5.1)
Sodium: 138 mmol/L (ref 135–145)

## 2018-10-06 LAB — D-DIMER, QUANTITATIVE: D-Dimer, Quant: 0.38 ug/mL-FEU (ref 0.00–0.50)

## 2018-10-06 MED ORDER — AZITHROMYCIN 250 MG PO TABS: 250.0000 mg | ORAL_TABLET | Freq: Every day | ORAL | 0 refills | Status: DC

## 2018-10-06 MED ORDER — ALBUTEROL SULFATE HFA 108 (90 BASE) MCG/ACT IN AERS
2.0000 | INHALATION_SPRAY | Freq: Four times a day (QID) | RESPIRATORY_TRACT | Status: DC
  Administered 2018-10-06: 2 via RESPIRATORY_TRACT
  Filled 2018-10-06: qty 6.7

## 2018-10-06 NOTE — ED Notes (Signed)
Portable Xray at bedside.
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
## 2018-10-06 NOTE — ED Triage Notes (Signed)
Sob for a week. Cough. Bloody sputum all night.

## 2018-10-06 NOTE — Discharge Instructions (Addendum)
Use the albuterol inhaler 2 puffs every 6 hours at least for the next 7 days.  Take the antibiotic Zithromax as directed over the next few days.  Chest x-ray negative.  Symptoms probably consistent with bronchitis.  Return for coughing up large amounts of blood.  Or for any new or worse symptoms.

## 2018-10-09 ENCOUNTER — Other Ambulatory Visit: Payer: Self-pay | Admitting: Cardiology

## 2018-10-13 ENCOUNTER — Ambulatory Visit: Payer: Self-pay | Admitting: Cardiology

## 2018-10-16 ENCOUNTER — Encounter (HOSPITAL_BASED_OUTPATIENT_CLINIC_OR_DEPARTMENT_OTHER): Payer: Self-pay | Admitting: Emergency Medicine

## 2018-10-16 ENCOUNTER — Emergency Department (HOSPITAL_BASED_OUTPATIENT_CLINIC_OR_DEPARTMENT_OTHER)
Admission: EM | Admit: 2018-10-16 | Discharge: 2018-10-16 | DRG: 204 | Discharge status: Against Medical Advice | Payer: Medicaid Other | Attending: Internal Medicine | Admitting: Internal Medicine

## 2018-10-16 ENCOUNTER — Emergency Department (HOSPITAL_BASED_OUTPATIENT_CLINIC_OR_DEPARTMENT_OTHER): Payer: Medicaid Other

## 2018-10-16 ENCOUNTER — Other Ambulatory Visit: Payer: Self-pay

## 2018-10-16 DIAGNOSIS — I509 Heart failure, unspecified: Secondary | ICD-10-CM | POA: Diagnosis not present

## 2018-10-16 DIAGNOSIS — I110 Hypertensive heart disease with heart failure: Secondary | ICD-10-CM | POA: Diagnosis not present

## 2018-10-16 DIAGNOSIS — F17290 Nicotine dependence, other tobacco product, uncomplicated: Secondary | ICD-10-CM | POA: Diagnosis present

## 2018-10-16 DIAGNOSIS — Z79899 Other long term (current) drug therapy: Secondary | ICD-10-CM | POA: Diagnosis not present

## 2018-10-16 DIAGNOSIS — J9601 Acute respiratory failure with hypoxia: Secondary | ICD-10-CM | POA: Diagnosis present

## 2018-10-16 DIAGNOSIS — J189 Pneumonia, unspecified organism: Secondary | ICD-10-CM | POA: Diagnosis present

## 2018-10-16 DIAGNOSIS — G8929 Other chronic pain: Secondary | ICD-10-CM | POA: Diagnosis not present

## 2018-10-16 DIAGNOSIS — I428 Other cardiomyopathies: Secondary | ICD-10-CM | POA: Diagnosis present

## 2018-10-16 DIAGNOSIS — Z7951 Long term (current) use of inhaled steroids: Secondary | ICD-10-CM | POA: Diagnosis not present

## 2018-10-16 DIAGNOSIS — Z8249 Family history of ischemic heart disease and other diseases of the circulatory system: Secondary | ICD-10-CM

## 2018-10-16 DIAGNOSIS — Z823 Family history of stroke: Secondary | ICD-10-CM

## 2018-10-16 DIAGNOSIS — R0602 Shortness of breath: Secondary | ICD-10-CM | POA: Diagnosis not present

## 2018-10-16 DIAGNOSIS — Z7952 Long term (current) use of systemic steroids: Secondary | ICD-10-CM | POA: Diagnosis not present

## 2018-10-16 DIAGNOSIS — J849 Interstitial pulmonary disease, unspecified: Secondary | ICD-10-CM

## 2018-10-16 DIAGNOSIS — Z20828 Contact with and (suspected) exposure to other viral communicable diseases: Secondary | ICD-10-CM | POA: Diagnosis not present

## 2018-10-16 DIAGNOSIS — M549 Dorsalgia, unspecified: Secondary | ICD-10-CM | POA: Diagnosis not present

## 2018-10-16 HISTORY — DX: Heart failure, unspecified: I50.9

## 2018-10-16 LAB — BASIC METABOLIC PANEL
Anion gap: 11 (ref 5–15)
BUN: 11 mg/dL (ref 6–20)
CO2: 24 mmol/L (ref 22–32)
Calcium: 8.9 mg/dL (ref 8.9–10.3)
Chloride: 105 mmol/L (ref 98–111)
Creatinine, Ser: 1.29 mg/dL — ABNORMAL HIGH (ref 0.61–1.24)
GFR calc Af Amer: >60 mL/min (ref >60)
GFR calc non Af Amer: >60 mL/min (ref >60)
Glucose, Bld: 116 mg/dL — ABNORMAL HIGH (ref 70–99)
Potassium: 3.6 mmol/L (ref 3.5–5.1)
Sodium: 140 mmol/L (ref 135–145)

## 2018-10-16 LAB — CBC WITH DIFFERENTIAL/PLATELET
Abs Immature Granulocytes: 0.05 10*3/uL (ref 0.00–0.07)
Basophils Absolute: 0 10*3/uL (ref 0.0–0.1)
Basophils Relative: 0 %
Eosinophils Absolute: 0.1 10*3/uL (ref 0.0–0.5)
Eosinophils Relative: 1 %
HCT: 45.7 % (ref 39.0–52.0)
Hemoglobin: 14.3 g/dL (ref 13.0–17.0)
Immature Granulocytes: 0 %
Lymphocytes Relative: 13 %
Lymphs Abs: 1.4 10*3/uL (ref 0.7–4.0)
MCH: 28.9 pg (ref 26.0–34.0)
MCHC: 31.3 g/dL (ref 30.0–36.0)
MCV: 92.5 fL (ref 80.0–100.0)
Monocytes Absolute: 0.8 10*3/uL (ref 0.1–1.0)
Monocytes Relative: 7 %
Neutro Abs: 8.9 10*3/uL — ABNORMAL HIGH (ref 1.7–7.7)
Neutrophils Relative %: 79 %
Platelets: 329 10*3/uL (ref 150–400)
RBC: 4.94 MIL/uL (ref 4.22–5.81)
RDW: 14 % (ref 11.5–15.5)
WBC: 11.3 10*3/uL — ABNORMAL HIGH (ref 4.0–10.5)
nRBC: 0 % (ref 0.0–0.2)

## 2018-10-16 LAB — POCT I-STAT 7, (LYTES, BLD GAS, ICA,H+H)
Acid-Base Excess: 2 mmol/L (ref 0.0–2.0)
Bicarbonate: 26.1 mmol/L (ref 20.0–28.0)
Calcium, Ion: 1.24 mmol/L (ref 1.15–1.40)
HCT: 45 % (ref 39.0–52.0)
Hemoglobin: 15.3 g/dL (ref 13.0–17.0)
O2 Saturation: 95 %
Patient temperature: 99.7
Potassium: 3.6 mmol/L (ref 3.5–5.1)
Sodium: 141 mmol/L (ref 135–145)
TCO2: 27 mmol/L (ref 22–32)
pCO2 arterial: 37.8 mmHg (ref 32.0–48.0)
pH, Arterial: 7.449 (ref 7.350–7.450)
pO2, Arterial: 73 mmHg — ABNORMAL LOW (ref 83.0–108.0)

## 2018-10-16 LAB — SARS CORONAVIRUS 2 BY RT PCR (HOSPITAL ORDER, PERFORMED IN ~~LOC~~ HOSPITAL LAB): SARS Coronavirus 2: NEGATIVE

## 2018-10-16 LAB — INFLUENZA PANEL BY PCR (TYPE A & B)
Influenza A By PCR: NEGATIVE
Influenza B By PCR: NEGATIVE

## 2018-10-16 LAB — TROPONIN I (HIGH SENSITIVITY)
Troponin I (High Sensitivity): 22 ng/L — ABNORMAL HIGH (ref <18)
Troponin I (High Sensitivity): 24 ng/L — ABNORMAL HIGH (ref <18)

## 2018-10-16 LAB — BRAIN NATRIURETIC PEPTIDE: B Natriuretic Peptide: 383.2 pg/mL — ABNORMAL HIGH (ref 0.0–100.0)

## 2018-10-16 MED ORDER — FUROSEMIDE 10 MG/ML IJ SOLN
40.0000 mg | Freq: Once | INTRAMUSCULAR | Status: AC
  Administered 2018-10-16: 07:00:00 40 mg via INTRAVENOUS
  Filled 2018-10-16: qty 4

## 2018-10-16 MED ORDER — SODIUM CHLORIDE 0.9 % IV SOLN
1.0000 g | Freq: Once | INTRAVENOUS | Status: AC
  Administered 2018-10-16: 05:00:00 1 g via INTRAVENOUS
  Filled 2018-10-16: qty 10

## 2018-10-16 MED ORDER — SODIUM CHLORIDE 0.9 % IV SOLN
INTRAVENOUS | Status: DC | PRN
  Administered 2018-10-16: 05:00:00 500 mL via INTRAVENOUS

## 2018-10-16 MED ORDER — SODIUM CHLORIDE 0.9 % IV SOLN
500.0000 mg | Freq: Once | INTRAVENOUS | Status: AC
  Administered 2018-10-16: 500 mg via INTRAVENOUS
  Filled 2018-10-16: qty 500
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
## 2018-10-16 NOTE — ED Triage Notes (Signed)
Shortness of breath, blood tinged sputum for over a week. Reports recent diagnosis of bronchitis. Hot to the touch. States "I think it's my time to go."

## 2018-10-16 NOTE — ED Notes (Signed)
Pt. Has taken off all cardiac monitoring and O2sat.  Pt. Wanting to leave.

## 2018-10-16 NOTE — ED Notes (Signed)
[facility] arrives for pt. He refuses to go stating he is going home. Pt refused to sign AMA. Pt explained risks of leaving up to and including death. Pt still refusing to be transported. Iv removed.

## 2018-10-16 NOTE — ED Notes (Signed)
Pt has declared to nurse that when his bed gets ready at [facility] he intends to leave here and drive himself to [facility] instead of waiting "hours" for transportation from [place].  He sts he did that the last time he was admitted from this location. EDP has been made aware of this declaration.

## 2018-10-16 NOTE — Plan of Care (Signed)
[facility] hospitalist note:  33 yo M with h/o NICM EF 20-25%.  Presents to ED with cough, SOB.  Has already failed outpatient zithromax.  Tm 99.7 in ED, CXR shows multifocal PNA (favored over pulm edema).  COVID is negative.  Put on rocephin / azithro.  Seems like CAP vs COVID (test neg) vs pulm edema.  Or maybe early (in the season) flu.  Will admit as a PUI to SDU, but EDP aware we arnt sure when he will actually get a bed.  Patient breathing 50 times a min, so EDP going to try giving him 40iv lasix to see if we can save him from a tube.

## 2018-10-16 NOTE — ED Notes (Signed)
In to check on the Pt.   His wife is coming back to be with him.  Pt. Fully dressed in the bed refusing to wear heart monitor and finger sat.  Pt. Asking about [facility] arrival.  RN explained again that the arrival of [place] will be a phone Call by them when they are in route.  Pt. Has been given a full explanation on [facility] several times and the delays.

## 2018-10-16 NOTE — ED Notes (Signed)
Pt called out stating he was SOB, this RN went to assess where pt sat was 99%, in no apparent distress, and continued to c/o weight time for [facility] truck for transfer. Pt stated he was going to call 911 to come pick him up now as he was tired of waiting, this RN explained he would lose bed if he left facility and fastest way to his assigned bed would be to continue waiting. Water refilled, pt stated there was nothing else to make him more comfortable.

## 2018-10-16 NOTE — ED Notes (Signed)
ED Provider at bedside discussing the reason we cannot allow him to go to [facility] by POV and that he will have to go through the ED and start all over if he chooses to leave.

## 2018-10-16 NOTE — ED Notes (Signed)
Pt is verbally aggressive, angry that he is still waiting, and threatening to leave. I explained and apologized for the delay once again. His wife is aware she can leave and get the pt something to eat.

## 2018-10-16 NOTE — ED Notes (Signed)
Pt called out stating he bent his arm and needed his IV checked, this RN checked IV and educated pt that the plastic catheter is in fact bendable. Pt then stated he wanted to get up to 'walk around and do jumping jacks', provided pt with non skid socks and informed pt to keep call bell within reach. Blinds opened for obs.

## 2018-10-17 ENCOUNTER — Inpatient Hospital Stay (HOSPITAL_COMMUNITY)
Admission: EM | Admit: 2018-10-17 | Discharge: 2018-10-20 | DRG: 286 | Disposition: A | Discharge status: Home / Self Care | Payer: Medicaid Other | Attending: Cardiology | Admitting: Cardiology

## 2018-10-17 ENCOUNTER — Emergency Department (HOSPITAL_COMMUNITY): Payer: Medicaid Other

## 2018-10-17 ENCOUNTER — Encounter (HOSPITAL_COMMUNITY): Payer: Self-pay | Admitting: *Deleted

## 2018-10-17 ENCOUNTER — Other Ambulatory Visit: Payer: Self-pay

## 2018-10-17 DIAGNOSIS — Z9114 Patient's other noncompliance with medication regimen: Secondary | ICD-10-CM

## 2018-10-17 DIAGNOSIS — I5043 Acute on chronic combined systolic (congestive) and diastolic (congestive) heart failure: Secondary | ICD-10-CM | POA: Diagnosis present

## 2018-10-17 DIAGNOSIS — I5022 Chronic systolic (congestive) heart failure: Secondary | ICD-10-CM | POA: Diagnosis present

## 2018-10-17 DIAGNOSIS — I509 Heart failure, unspecified: Secondary | ICD-10-CM

## 2018-10-17 DIAGNOSIS — I10 Essential (primary) hypertension: Secondary | ICD-10-CM | POA: Diagnosis present

## 2018-10-17 DIAGNOSIS — E785 Hyperlipidemia, unspecified: Secondary | ICD-10-CM | POA: Diagnosis present

## 2018-10-17 DIAGNOSIS — I2510 Atherosclerotic heart disease of native coronary artery without angina pectoris: Secondary | ICD-10-CM | POA: Diagnosis present

## 2018-10-17 DIAGNOSIS — J189 Pneumonia, unspecified organism: Secondary | ICD-10-CM | POA: Diagnosis present

## 2018-10-17 DIAGNOSIS — Z8249 Family history of ischemic heart disease and other diseases of the circulatory system: Secondary | ICD-10-CM

## 2018-10-17 DIAGNOSIS — F17290 Nicotine dependence, other tobacco product, uncomplicated: Secondary | ICD-10-CM | POA: Diagnosis present

## 2018-10-17 DIAGNOSIS — M549 Dorsalgia, unspecified: Secondary | ICD-10-CM | POA: Diagnosis present

## 2018-10-17 DIAGNOSIS — F40240 Claustrophobia: Secondary | ICD-10-CM | POA: Diagnosis present

## 2018-10-17 DIAGNOSIS — Z79899 Other long term (current) drug therapy: Secondary | ICD-10-CM

## 2018-10-17 DIAGNOSIS — I130 Hypertensive heart and chronic kidney disease with heart failure and stage 1 through stage 4 chronic kidney disease, or unspecified chronic kidney disease: Principal | ICD-10-CM | POA: Diagnosis present

## 2018-10-17 DIAGNOSIS — Z23 Encounter for immunization: Secondary | ICD-10-CM

## 2018-10-17 DIAGNOSIS — Z20828 Contact with and (suspected) exposure to other viral communicable diseases: Secondary | ICD-10-CM | POA: Diagnosis present

## 2018-10-17 DIAGNOSIS — I5023 Acute on chronic systolic (congestive) heart failure: Secondary | ICD-10-CM | POA: Diagnosis present

## 2018-10-17 DIAGNOSIS — G8929 Other chronic pain: Secondary | ICD-10-CM | POA: Diagnosis present

## 2018-10-17 DIAGNOSIS — F419 Anxiety disorder, unspecified: Secondary | ICD-10-CM | POA: Diagnosis present

## 2018-10-17 DIAGNOSIS — Z9119 Patient's noncompliance with other medical treatment and regimen: Secondary | ICD-10-CM

## 2018-10-17 DIAGNOSIS — N183 Chronic kidney disease, stage 3 unspecified: Secondary | ICD-10-CM | POA: Diagnosis present

## 2018-10-17 DIAGNOSIS — N182 Chronic kidney disease, stage 2 (mild): Secondary | ICD-10-CM | POA: Diagnosis present

## 2018-10-17 DIAGNOSIS — I428 Other cardiomyopathies: Secondary | ICD-10-CM

## 2018-10-17 HISTORY — DX: Hyperlipidemia, unspecified: E78.5

## 2018-10-17 HISTORY — DX: Chronic combined systolic (congestive) and diastolic (congestive) heart failure: I50.42

## 2018-10-17 HISTORY — DX: Chronic kidney disease, stage 2 (mild): N18.2

## 2018-10-17 HISTORY — DX: Patient's noncompliance with other medical treatment and regimen due to unspecified reason: Z91.199

## 2018-10-17 HISTORY — DX: Patient's noncompliance with other medical treatment and regimen: Z91.19

## 2018-10-17 LAB — CBC
HCT: 46 % (ref 39.0–52.0)
Hemoglobin: 14.7 g/dL (ref 13.0–17.0)
MCH: 29.6 pg (ref 26.0–34.0)
MCHC: 32 g/dL (ref 30.0–36.0)
MCV: 92.6 fL (ref 80.0–100.0)
Platelets: 315 10*3/uL (ref 150–400)
RBC: 4.97 MIL/uL (ref 4.22–5.81)
RDW: 13.7 % (ref 11.5–15.5)
WBC: 10.4 10*3/uL (ref 4.0–10.5)
nRBC: 0 % (ref 0.0–0.2)

## 2018-10-17 LAB — BASIC METABOLIC PANEL
Anion gap: 12 (ref 5–15)
BUN: 9 mg/dL (ref 6–20)
CO2: 22 mmol/L (ref 22–32)
Calcium: 8.8 mg/dL — ABNORMAL LOW (ref 8.9–10.3)
Chloride: 101 mmol/L (ref 98–111)
Creatinine, Ser: 1.4 mg/dL — ABNORMAL HIGH (ref 0.61–1.24)
GFR calc Af Amer: >60 mL/min (ref >60)
GFR calc non Af Amer: >60 mL/min (ref >60)
Glucose, Bld: 103 mg/dL — ABNORMAL HIGH (ref 70–99)
Potassium: 3.6 mmol/L (ref 3.5–5.1)
Sodium: 135 mmol/L (ref 135–145)

## 2018-10-17 LAB — TROPONIN I (HIGH SENSITIVITY)
Troponin I (High Sensitivity): 131 ng/L (ref <18)
Troponin I (High Sensitivity): 186 ng/L (ref <18)

## 2018-10-17 LAB — SARS CORONAVIRUS 2 BY RT PCR (HOSPITAL ORDER, PERFORMED IN ~~LOC~~ HOSPITAL LAB): SARS Coronavirus 2: NEGATIVE

## 2018-10-17 LAB — PROCALCITONIN: Procalcitonin: <0.1 ng/mL

## 2018-10-17 LAB — BRAIN NATRIURETIC PEPTIDE: B Natriuretic Peptide: 313 pg/mL — ABNORMAL HIGH (ref 0.0–100.0)

## 2018-10-17 MED ORDER — SODIUM CHLORIDE 0.9 % IV SOLN
250.0000 mL | INTRAVENOUS | Status: DC | PRN
  Administered 2018-10-18: 250 mL via INTRAVENOUS

## 2018-10-17 MED ORDER — ENOXAPARIN SODIUM 40 MG/0.4ML ~~LOC~~ SOLN
40.0000 mg | SUBCUTANEOUS | Status: DC
  Administered 2018-10-17 – 2018-10-18 (×2): 40 mg via SUBCUTANEOUS
  Filled 2018-10-17 (×2): qty 0.4

## 2018-10-17 MED ORDER — SODIUM CHLORIDE 0.9% FLUSH: 3.0000 mL | INTRAVENOUS | Status: DC | PRN

## 2018-10-17 MED ORDER — AZITHROMYCIN 250 MG PO TABS
250.0000 mg | ORAL_TABLET | Freq: Every day | ORAL | Status: DC
  Administered 2018-10-18: 250 mg via ORAL
  Filled 2018-10-17: qty 1

## 2018-10-17 MED ORDER — FUROSEMIDE 10 MG/ML IJ SOLN
40.0000 mg | Freq: Every day | INTRAMUSCULAR | Status: DC
  Filled 2018-10-17: qty 4

## 2018-10-17 MED ORDER — IRBESARTAN 150 MG PO TABS
150.0000 mg | ORAL_TABLET | Freq: Every day | ORAL | Status: DC
  Filled 2018-10-17: qty 1

## 2018-10-17 MED ORDER — SPIRONOLACTONE 25 MG PO TABS
25.0000 mg | ORAL_TABLET | Freq: Every day | ORAL | Status: DC
  Filled 2018-10-17: qty 1

## 2018-10-17 MED ORDER — PNEUMOCOCCAL VAC POLYVALENT 25 MCG/0.5ML IJ INJ
0.5000 mL | INJECTION | INTRAMUSCULAR | Status: DC
  Filled 2018-10-17: qty 0.5

## 2018-10-17 MED ORDER — FUROSEMIDE 10 MG/ML IJ SOLN: 40.0000 mg | Freq: Two times a day (BID) | INTRAMUSCULAR | Status: DC

## 2018-10-17 MED ORDER — FUROSEMIDE 10 MG/ML IJ SOLN
40.0000 mg | Freq: Once | INTRAMUSCULAR | Status: AC
  Administered 2018-10-17: 40 mg via INTRAVENOUS
  Filled 2018-10-17: qty 4

## 2018-10-17 MED ORDER — ENSURE ENLIVE PO LIQD
237.0000 mL | Freq: Two times a day (BID) | ORAL | Status: DC
  Administered 2018-10-18 (×2): 237 mL via ORAL

## 2018-10-17 MED ORDER — SODIUM CHLORIDE 0.9% FLUSH
3.0000 mL | Freq: Once | INTRAVENOUS | Status: AC
  Administered 2018-10-17: 3 mL via INTRAVENOUS

## 2018-10-17 MED ORDER — SODIUM CHLORIDE 0.9 % IV SOLN
1.0000 g | INTRAVENOUS | Status: DC
  Administered 2018-10-17 – 2018-10-18 (×2): 1 g via INTRAVENOUS
  Filled 2018-10-17 (×2): qty 10

## 2018-10-17 MED ORDER — ACETAMINOPHEN 325 MG PO TABS
650.0000 mg | ORAL_TABLET | ORAL | Status: DC | PRN
  Administered 2018-10-18: 650 mg via ORAL
  Filled 2018-10-17: qty 2

## 2018-10-17 MED ORDER — FUROSEMIDE 10 MG/ML IJ SOLN: 40.0000 mg | Freq: Every day | INTRAMUSCULAR | Status: DC

## 2018-10-17 MED ORDER — AZITHROMYCIN 250 MG PO TABS
500.0000 mg | ORAL_TABLET | Freq: Every day | ORAL | Status: AC
  Administered 2018-10-17: 500 mg via ORAL
  Filled 2018-10-17: qty 2

## 2018-10-17 MED ORDER — ONDANSETRON HCL 4 MG/2ML IJ SOLN: 4.0000 mg | Freq: Four times a day (QID) | INTRAMUSCULAR | Status: DC | PRN

## 2018-10-17 MED ORDER — INFLUENZA VAC SPLIT QUAD 0.5 ML IM SUSY
0.5000 mL | PREFILLED_SYRINGE | INTRAMUSCULAR | Status: AC
  Administered 2018-10-20: 0.5 mL via INTRAMUSCULAR
  Filled 2018-10-17: qty 0.5

## 2018-10-17 MED ORDER — SODIUM CHLORIDE 0.9% FLUSH
3.0000 mL | Freq: Two times a day (BID) | INTRAVENOUS | Status: DC
  Administered 2018-10-17 – 2018-10-19 (×3): 3 mL via INTRAVENOUS
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
## 2018-10-17 NOTE — ED Triage Notes (Addendum)
Pt states he was tx with lasix at [facility] yesterday for an enlarged heart.  States he had been sob and having chest pain x 1 week.  They were going to send him here, but he got tired of waiting and just came here.

## 2018-10-18 ENCOUNTER — Observation Stay (HOSPITAL_BASED_OUTPATIENT_CLINIC_OR_DEPARTMENT_OTHER): Payer: Medicaid Other

## 2018-10-18 ENCOUNTER — Encounter (HOSPITAL_COMMUNITY): Payer: Self-pay | Admitting: Physician Assistant

## 2018-10-18 DIAGNOSIS — N182 Chronic kidney disease, stage 2 (mild): Secondary | ICD-10-CM | POA: Diagnosis present

## 2018-10-18 DIAGNOSIS — I509 Heart failure, unspecified: Secondary | ICD-10-CM

## 2018-10-18 DIAGNOSIS — E785 Hyperlipidemia, unspecified: Secondary | ICD-10-CM | POA: Diagnosis present

## 2018-10-18 DIAGNOSIS — I5023 Acute on chronic systolic (congestive) heart failure: Secondary | ICD-10-CM

## 2018-10-18 DIAGNOSIS — F419 Anxiety disorder, unspecified: Secondary | ICD-10-CM | POA: Diagnosis present

## 2018-10-18 DIAGNOSIS — I4581 Long QT syndrome: Secondary | ICD-10-CM

## 2018-10-18 DIAGNOSIS — Z8249 Family history of ischemic heart disease and other diseases of the circulatory system: Secondary | ICD-10-CM | POA: Diagnosis not present

## 2018-10-18 DIAGNOSIS — I429 Cardiomyopathy, unspecified: Secondary | ICD-10-CM

## 2018-10-18 DIAGNOSIS — Z79899 Other long term (current) drug therapy: Secondary | ICD-10-CM | POA: Diagnosis not present

## 2018-10-18 DIAGNOSIS — F17290 Nicotine dependence, other tobacco product, uncomplicated: Secondary | ICD-10-CM | POA: Diagnosis present

## 2018-10-18 DIAGNOSIS — I340 Nonrheumatic mitral (valve) insufficiency: Secondary | ICD-10-CM

## 2018-10-18 DIAGNOSIS — J189 Pneumonia, unspecified organism: Secondary | ICD-10-CM | POA: Diagnosis present

## 2018-10-18 DIAGNOSIS — Z23 Encounter for immunization: Secondary | ICD-10-CM | POA: Diagnosis not present

## 2018-10-18 DIAGNOSIS — Z9119 Patient's noncompliance with other medical treatment and regimen: Secondary | ICD-10-CM | POA: Diagnosis not present

## 2018-10-18 DIAGNOSIS — I428 Other cardiomyopathies: Secondary | ICD-10-CM | POA: Diagnosis present

## 2018-10-18 DIAGNOSIS — I130 Hypertensive heart and chronic kidney disease with heart failure and stage 1 through stage 4 chronic kidney disease, or unspecified chronic kidney disease: Secondary | ICD-10-CM | POA: Diagnosis present

## 2018-10-18 DIAGNOSIS — R7989 Other specified abnormal findings of blood chemistry: Secondary | ICD-10-CM

## 2018-10-18 DIAGNOSIS — I361 Nonrheumatic tricuspid (valve) insufficiency: Secondary | ICD-10-CM

## 2018-10-18 DIAGNOSIS — I5043 Acute on chronic combined systolic (congestive) and diastolic (congestive) heart failure: Secondary | ICD-10-CM | POA: Diagnosis present

## 2018-10-18 DIAGNOSIS — I2510 Atherosclerotic heart disease of native coronary artery without angina pectoris: Secondary | ICD-10-CM | POA: Diagnosis present

## 2018-10-18 DIAGNOSIS — M549 Dorsalgia, unspecified: Secondary | ICD-10-CM | POA: Diagnosis present

## 2018-10-18 DIAGNOSIS — R0602 Shortness of breath: Secondary | ICD-10-CM | POA: Diagnosis present

## 2018-10-18 DIAGNOSIS — G8929 Other chronic pain: Secondary | ICD-10-CM | POA: Diagnosis present

## 2018-10-18 DIAGNOSIS — Z20828 Contact with and (suspected) exposure to other viral communicable diseases: Secondary | ICD-10-CM | POA: Diagnosis present

## 2018-10-18 DIAGNOSIS — Z9114 Patient's other noncompliance with medication regimen: Secondary | ICD-10-CM | POA: Diagnosis not present

## 2018-10-18 LAB — URINALYSIS, ROUTINE W REFLEX MICROSCOPIC
Bilirubin Urine: NEGATIVE
Glucose, UA: NEGATIVE mg/dL
Hgb urine dipstick: NEGATIVE
Ketones, ur: NEGATIVE mg/dL
Leukocytes,Ua: NEGATIVE
Nitrite: NEGATIVE
Protein, ur: NEGATIVE mg/dL
Specific Gravity, Urine: 1.021 (ref 1.005–1.030)
pH: 6 (ref 5.0–8.0)

## 2018-10-18 LAB — BASIC METABOLIC PANEL
Anion gap: 10 (ref 5–15)
BUN: 11 mg/dL (ref 6–20)
CO2: 24 mmol/L (ref 22–32)
Calcium: 8.7 mg/dL — ABNORMAL LOW (ref 8.9–10.3)
Chloride: 105 mmol/L (ref 98–111)
Creatinine, Ser: 1.44 mg/dL — ABNORMAL HIGH (ref 0.61–1.24)
GFR calc Af Amer: >60 mL/min (ref >60)
GFR calc non Af Amer: >60 mL/min (ref >60)
Glucose, Bld: 110 mg/dL — ABNORMAL HIGH (ref 70–99)
Potassium: 3.7 mmol/L (ref 3.5–5.1)
Sodium: 139 mmol/L (ref 135–145)

## 2018-10-18 LAB — TROPONIN I (HIGH SENSITIVITY)
Troponin I (High Sensitivity): 186 ng/L (ref <18)
Troponin I (High Sensitivity): 149 ng/L

## 2018-10-18 LAB — ETHANOL: Alcohol, Ethyl (B): <10 mg/dL (ref <10)

## 2018-10-18 LAB — RAPID URINE DRUG SCREEN, HOSP PERFORMED
Amphetamines: NOT DETECTED
Barbiturates: NOT DETECTED
Benzodiazepines: NOT DETECTED
Cocaine: NOT DETECTED
Opiates: NOT DETECTED
Tetrahydrocannabinol: POSITIVE — AB

## 2018-10-18 LAB — ECHOCARDIOGRAM COMPLETE
Height: 70 in
Weight: 3572.8 oz

## 2018-10-18 LAB — APTT: aPTT: 35 seconds (ref 24–36)

## 2018-10-18 LAB — HEPATIC FUNCTION PANEL
ALT: 43 U/L (ref 0–44)
AST: 30 U/L (ref 15–41)
Albumin: 3.1 g/dL — ABNORMAL LOW (ref 3.5–5.0)
Alkaline Phosphatase: 51 U/L (ref 38–126)
Bilirubin, Direct: 0.1 mg/dL (ref 0.0–0.2)
Indirect Bilirubin: 0.6 mg/dL (ref 0.3–0.9)
Total Bilirubin: 0.7 mg/dL (ref 0.3–1.2)
Total Protein: 7 g/dL (ref 6.5–8.1)

## 2018-10-18 LAB — PROTIME-INR
INR: 1.2 (ref 0.8–1.2)
Prothrombin Time: 14.6 s (ref 11.4–15.2)

## 2018-10-18 LAB — HIV ANTIBODY (ROUTINE TESTING W REFLEX): HIV Screen 4th Generation wRfx: NONREACTIVE

## 2018-10-18 LAB — TSH: TSH: 1.187 u[IU]/mL (ref 0.350–4.500)

## 2018-10-18 LAB — MAGNESIUM: Magnesium: 2.2 mg/dL (ref 1.7–2.4)

## 2018-10-18 LAB — T4, FREE: Free T4: 1.01 ng/dL (ref 0.61–1.12)

## 2018-10-18 MED ORDER — LOSARTAN POTASSIUM 25 MG PO TABS
12.5000 mg | ORAL_TABLET | Freq: Every day | ORAL | Status: DC
  Administered 2018-10-18: 11:00:00 12.5 mg via ORAL
  Filled 2018-10-18: qty 1

## 2018-10-18 MED ORDER — SODIUM CHLORIDE 0.9 % IV SOLN: 250.0000 mL | INTRAVENOUS | Status: DC | PRN

## 2018-10-18 MED ORDER — FUROSEMIDE 10 MG/ML IJ SOLN
80.0000 mg | Freq: Two times a day (BID) | INTRAMUSCULAR | Status: DC
  Administered 2018-10-18 – 2018-10-19 (×2): 80 mg via INTRAVENOUS
  Filled 2018-10-18 (×2): qty 8

## 2018-10-18 MED ORDER — ATORVASTATIN CALCIUM 40 MG PO TABS
40.0000 mg | ORAL_TABLET | Freq: Every day | ORAL | Status: DC
  Administered 2018-10-18 – 2018-10-19 (×2): 40 mg via ORAL
  Filled 2018-10-18 (×2): qty 1

## 2018-10-18 MED ORDER — POTASSIUM CHLORIDE CRYS ER 20 MEQ PO TBCR
40.0000 meq | EXTENDED_RELEASE_TABLET | Freq: Once | ORAL | Status: AC
  Administered 2018-10-18: 40 meq via ORAL
  Filled 2018-10-18: qty 2

## 2018-10-18 MED ORDER — SODIUM CHLORIDE 0.9 % IV SOLN
INTRAVENOUS | Status: DC
  Administered 2018-10-19: 07:00:00 via INTRAVENOUS

## 2018-10-18 MED ORDER — SODIUM CHLORIDE 0.9% FLUSH: 3.0000 mL | INTRAVENOUS | Status: DC | PRN

## 2018-10-18 MED ORDER — IRBESARTAN 150 MG PO TABS: 150.0000 mg | ORAL_TABLET | Freq: Every day | ORAL | Status: DC

## 2018-10-18 MED ORDER — DOXYLAMINE SUCCINATE (SLEEP) 25 MG PO TABS
25.0000 mg | ORAL_TABLET | Freq: Every evening | ORAL | Status: DC | PRN
  Administered 2018-10-18: 25 mg via ORAL
  Filled 2018-10-18 (×2): qty 1

## 2018-10-18 MED ORDER — ASPIRIN 81 MG PO CHEW
81.0000 mg | CHEWABLE_TABLET | ORAL | Status: AC
  Administered 2018-10-19: 05:00:00 81 mg via ORAL
  Filled 2018-10-18: qty 1

## 2018-10-18 MED ORDER — SPIRONOLACTONE 12.5 MG HALF TABLET
12.5000 mg | ORAL_TABLET | Freq: Every day | ORAL | Status: DC
  Administered 2018-10-18 – 2018-10-19 (×2): 12.5 mg via ORAL
  Filled 2018-10-18: qty 1

## 2018-10-18 MED ORDER — DIGOXIN 125 MCG PO TABS
0.1250 mg | ORAL_TABLET | Freq: Every day | ORAL | Status: DC
  Administered 2018-10-18 – 2018-10-20 (×3): 0.125 mg via ORAL
  Filled 2018-10-18 (×3): qty 1

## 2018-10-18 MED ORDER — DOXYCYCLINE HYCLATE 100 MG PO TABS
100.0000 mg | ORAL_TABLET | Freq: Two times a day (BID) | ORAL | Status: DC
  Administered 2018-10-18 – 2018-10-19 (×3): 100 mg via ORAL
  Filled 2018-10-18 (×3): qty 1

## 2018-10-18 MED ORDER — SODIUM CHLORIDE 0.9% FLUSH
3.0000 mL | Freq: Two times a day (BID) | INTRAVENOUS | Status: DC
  Administered 2018-10-18 – 2018-10-19 (×2): 3 mL via INTRAVENOUS

## 2018-10-18 MED ORDER — LOSARTAN POTASSIUM 25 MG PO TABS
12.5000 mg | ORAL_TABLET | Freq: Two times a day (BID) | ORAL | Status: DC
  Administered 2018-10-18: 12.5 mg via ORAL
  Filled 2018-10-18 (×2): qty 1

## 2018-10-18 NOTE — Progress Notes (Signed)
  Echocardiogram 2D Echocardiogram has been performed.  Marveline Profeta L Androw 10/18/2018, 9:41 AM
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
## 2018-10-18 NOTE — Progress Notes (Signed)
Patient accidentally partially pulled PIV from left arm. Staff completed removal.PIV intact.  Will attempt PIV placement.

## 2018-10-18 NOTE — Progress Notes (Addendum)
PROGRESS NOTE    Stephen Ruiz  BHA:[id number] DOB: 1985/08/18 DOA: 10/17/2018 PCP: Patient, No Pcp Per    Brief Narrative:  This is a 33 year old male with past medical history of NICM chronic systolic heart failure thought to be hypertensive (EF 20 -25%, follows with Corine Shelter, PA), pretension, illicit drug use (cocaine, opiates, ecstasy), alcohol, hyperlipidemia who was seen on 9/22 with cough and shortness of breath, started on azithromycin however subsequently presented to [facility] yesterday with complaints of worsening dyspnea on exertion x2 weeks and sudden onset cough and shortness of breath while at rest as well as hemoptysis.  He was given Lasix for elevated BNP and started on CAP treatment for abnormal chest x-ray concerning for multifocal pneumonia (COVID negative).  Patient left [place] AMA and drove to [facility] morning of 10/4.  Found to have elevated troponin, mildly elevated BNP and white count and continued treatment for CAP and diuresed for heart failure.  Cardiology was consulted   Assessment & Plan:   Principal Problem:   Acute on chronic systolic CHF (congestive heart failure) (HCC) Active Problems:   Essential hypertension   NICM (nonischemic cardiomyopathy) (HCC)   CAP (community acquired pneumonia)   Acute on chronic systolic congestive heart failure (HCC)   1. Acute on chronic systolic heart failure -follows with Corine Shelter, PA (cardiology).  Diagnosed in 2019, initially thought to be NICM and hypertensive etiology.  Noncompliant with meds due to lack of insurance.  Father died at 88 of heart failure.  Has not had ischemic work-up.  Has a history of cocaine and alcohol use in the past.  No change in diet recently, typically very active.  Home meds: Irbesartan, Lasix, spironolactone.  Troponin I 131->186 1. Cardiology consulted 2. Follow-up TSH, HIV and QuantiFERON 3. Lasix on hold as patient is euvolemic 4. Hold beta-blocker in acute  exacerbation 5. Avapro switched to Cozaar by cardiology, consider holding in setting of increasing creatinine and stable BP 6. Social work consulted for lack of health insurance leading to medication noncompliance 2. Elevated troponin -concerned this may be underlying CAD especially with T wave inversions in inferior leads, has chronic T wave inversion in the lateral leads 1. Trend troponin 2. Follow-up with cardiology, agree with right and left heart cath per the recommendations 3. Prolonged QT -aspirin and heparin on hold given hemoptysis 1. Avoid QT prolonging agents 2. Discontinue azithromycin and switch to doxycycline for now 4. Hemoptysis with questionable CAP -sounds more like blood-tinged sputum as opposed to massive hemoptysis.  Clinically does not look sick and has negative procalcitonin however his chest x-ray was concerning for showed multifocal pneumonia and patient had low-grade fever at previous hospital. Will wait for patient to be afebrile x48 hours before discontinuing.  Has received ceftriaxone and azithromycin 1. Continue ceftriaxone 2. Switch azithromycin for doxycycline in setting of prolonged QT 3. Follow-up QuantiFERON 4. Coags ordered 5. Consider ANA for hemoptysis and weight loss 5. Hypertension -stable 1. Plan as above 6. Hyperlipidemia  1. LFTs prior to starting statin 7. Elevated creatinine -has elevated creatinine at baseline however this is a young male with a lot of muscle mass which can falsely elevate creatinine and can be exacerbated by his heart failure 1. UA with micro 8. Polysubstance use 1. UDS 2. Ethanol level   DVT prophylaxis: scd Code Status: Full code  Called patient's wife without a response Disposition Plan: pending medical stability. Patient needs insurance, SW consulted  Consultants:   Cardiology  Procedures: echo   Antimicrobials: Ceftriaxone/ Azithromycin -> Ceftriaxone/Doxycycline 10/4   Subjective: Patient seen and  examined bedside lying flat on room air comfortably with no acute complaints at this time.  States that he has been having worsening shortness of breath over the past 2 weeks which suddenly worsened overnight 2 nights ago while at rest with chest pressure.  States that 1 month ago he went on a 6 mile bike ride and was in an otherwise good state of health.  Admits to history of cocaine, THC use as well as opiates and alcohol in the past but denies any recent use.  Reports his father passed away at 4253 of heart failure.  Currently without any chest pain, palpitations.  No other complaints  Objective: Vitals:   10/18/18 0401 10/18/18 0408 10/18/18 1019 10/18/18 1029  BP:  109/80 115/90   Pulse:  (!) 105 (!) 109 (!) 107  Resp:  16 18   Temp:  97.6 F (36.4 C) (!) 97.5 F (36.4 C)   TempSrc:  Oral Oral   SpO2:  97% 97% 100%  Weight: 101.3 kg     Height:        Intake/Output Summary (Last 24 hours) at 10/18/2018 1113 Last data filed at 10/18/2018 0500 Gross per 24 hour  Intake 248.9 ml  Output 850 ml  Net -601.1 ml   Filed Weights   10/17/18 2045 10/17/18 2244 10/18/18 0401  Weight: 102.5 kg 103.2 kg 101.3 kg    Examination:  General exam: Muscular male appears calm and comfortable Respiratory system: Clear to auscultation. Respiratory effort normal. Cardiovascular system: S1 & S2 heard, RRR. No JVD, murmurs, rubs, gallops or clicks. No pedal edema.  No orthopnea Gastrointestinal system: Abdomen is nondistended, soft and nontender. No organomegaly or masses felt. Normal bowel sounds heard. Central nervous system: Alert and oriented. No focal neurological deficits. Extremities: Symmetric 5 x 5 power. Skin: No rashes, lesions or ulcers Psychiatry: Judgement and insight appear normal. Mood & affect appropriate.     Data Reviewed: I have personally reviewed following labs and imaging studies  CBC: Recent Labs  Lab 10/16/18 0502 10/16/18 0516 10/17/18 1843  WBC  --  11.3* 10.4   NEUTROABS  --  8.9*  --   HGB 15.3 14.3 14.7  HCT 45.0 45.7 46.0  MCV  --  92.5 92.6  PLT  --  329 315   Basic Metabolic Panel: Recent Labs  Lab 10/16/18 0502 10/16/18 0516 10/17/18 1843 10/18/18 0711  NA 141 140 135 139  K 3.6 3.6 3.6 3.7  CL  --  105 101 105  CO2  --  24 22 24   GLUCOSE  --  116* 103* 110*  BUN  --  11 9 11   CREATININE  --  1.29* 1.40* 1.44*  CALCIUM  --  8.9 8.8* 8.7*   GFR: Estimated Creatinine Clearance: 87 mL/min (A) (by C-G formula based on SCr of 1.44 mg/dL (H)). Liver Function Tests: No results for input(s): AST, ALT, ALKPHOS, BILITOT, PROT, ALBUMIN in the last 168 hours. No results for input(s): LIPASE, AMYLASE in the last 168 hours. No results for input(s): AMMONIA in the last 168 hours. Coagulation Profile: No results for input(s): INR, PROTIME in the last 168 hours. Cardiac Enzymes: No results for input(s): CKTOTAL, CKMB, CKMBINDEX, TROPONINI in the last 168 hours. BNP (last 3 results) No results for input(s): PROBNP in the last 8760 hours. HbA1C: No results for input(s): HGBA1C in the last 72 hours. CBG:  No results for input(s): GLUCAP in the last 168 hours. Lipid Profile: No results for input(s): CHOL, HDL, LDLCALC, TRIG, CHOLHDL, LDLDIRECT in the last 72 hours. Thyroid Function Tests: No results for input(s): TSH, T4TOTAL, FREET4, T3FREE, THYROIDAB in the last 72 hours. Anemia Panel: No results for input(s): VITAMINB12, FOLATE, FERRITIN, TIBC, IRON, RETICCTPCT in the last 72 hours. Sepsis Labs: Recent Labs  Lab 10/17/18 2040  PROCALCITON <0.10    Recent Results (from the past 240 hour(s))  SARS Coronavirus 2 [facility] order, Performed in [facility] hospital lab) Nasopharyngeal Nasopharyngeal Swab     Status: None   Collection Time: 10/16/18  5:16 AM   Specimen: Nasopharyngeal Swab  Result Value Ref Range Status   SARS Coronavirus 2 NEGATIVE NEGATIVE Final    Comment: (NOTE) If result is NEGATIVE SARS-CoV-2 target nucleic  acids are NOT DETECTED. The SARS-CoV-2 RNA is generally detectable in upper and lower  respiratory specimens during the acute phase of infection. The lowest  concentration of SARS-CoV-2 viral copies this assay can detect is 250  copies / mL. A negative result does not preclude SARS-CoV-2 infection  and should not be used as the sole basis for treatment or other  patient management decisions.  A negative result may occur with  improper specimen collection / handling, submission of specimen other  than nasopharyngeal swab, presence of viral mutation(s) within the  areas targeted by this assay, and inadequate number of viral copies  (<250 copies / mL). A negative result must be combined with clinical  observations, patient history, and epidemiological information. If result is POSITIVE SARS-CoV-2 target nucleic acids are DETECTED. The SARS-CoV-2 RNA is generally detectable in upper and lower  respiratory specimens dur ing the acute phase of infection.  Positive  results are indicative of active infection with SARS-CoV-2.  Clinical  correlation with patient history and other diagnostic information is  necessary to determine patient infection status.  Positive results do  not rule out bacterial infection or co-infection with other viruses. If result is PRESUMPTIVE POSTIVE SARS-CoV-2 nucleic acids MAY BE PRESENT.   A presumptive positive result was obtained on the submitted specimen  and confirmed on repeat testing.  While 2019 novel coronavirus  (SARS-CoV-2) nucleic acids may be present in the submitted sample  additional confirmatory testing may be necessary for epidemiological  and / or clinical management purposes  to differentiate between  SARS-CoV-2 and other Sarbecovirus currently known to infect humans.  If clinically indicated additional testing with an alternate test  methodology [phone]) is advised. The SARS-CoV-2 RNA is generally  detectable in upper and lower respiratory sp  ecimens during the acute  phase of infection. The expected result is Negative. Fact Sheet for Patients:  BoilerBrush.com.cy Fact Sheet for Healthcare Providers: https://pope.com/ This test is not yet approved or cleared by the [place] FDA and has been authorized for detection and/or diagnosis of SARS-CoV-2 by FDA under an Emergency Use Authorization (EUA).  This EUA will remain in effect (meaning this test can be used) for the duration of the COVID-19 declaration under Section 564(b)(1) of the Act, 21 U.S.C. section 360bbb-3(b)(1), unless the authorization is terminated or revoked sooner. Performed at [facility], [address] [place], [place] [address]   SARS Coronavirus 2 [facility] order, Performed in [facility] hospital lab) Nasopharyngeal Nasopharyngeal Swab     Status: None   Collection Time: 10/17/18  8:06 PM   Specimen: Nasopharyngeal Swab  Result Value Ref Range Status   SARS  Coronavirus 2 NEGATIVE NEGATIVE Final    Comment: (NOTE) If result is NEGATIVE SARS-CoV-2 target nucleic acids are NOT DETECTED. The SARS-CoV-2 RNA is generally detectable in upper and lower  respiratory specimens during the acute phase of infection. The lowest  concentration of SARS-CoV-2 viral copies this assay can detect is 250  copies / mL. A negative result does not preclude SARS-CoV-2 infection  and should not be used as the sole basis for treatment or other  patient management decisions.  A negative result may occur with  improper specimen collection / handling, submission of specimen other  than nasopharyngeal swab, presence of viral mutation(s) within the  areas targeted by this assay, and inadequate number of viral copies  (<250 copies / mL). A negative result must be combined with clinical  observations, patient history, and epidemiological information. If result is POSITIVE SARS-CoV-2 target nucleic acids are DETECTED.  The SARS-CoV-2 RNA is generally detectable in upper and lower  respiratory specimens dur ing the acute phase of infection.  Positive  results are indicative of active infection with SARS-CoV-2.  Clinical  correlation with patient history and other diagnostic information is  necessary to determine patient infection status.  Positive results do  not rule out bacterial infection or co-infection with other viruses. If result is PRESUMPTIVE POSTIVE SARS-CoV-2 nucleic acids MAY BE PRESENT.   A presumptive positive result was obtained on the submitted specimen  and confirmed on repeat testing.  While 2019 novel coronavirus  (SARS-CoV-2) nucleic acids may be present in the submitted sample  additional confirmatory testing may be necessary for epidemiological  and / or clinical management purposes  to differentiate between  SARS-CoV-2 and other Sarbecovirus currently known to infect humans.  If clinically indicated additional testing with an alternate test  methodology [phone]([id number]) is advised. The SARS-CoV-2 RNA is generally  detectable in upper and lower respiratory sp ecimens during the acute  phase of infection. The expected result is Negative. Fact Sheet for Patients:  BoilerBrush.com.cyhttps://www.fda.gov/media/136312/download Fact Sheet for Healthcare Providers: https://pope.com/https://www.fda.gov/media/136313/download This test is not yet approved or cleared by the [place] FDA and has been authorized for detection and/or diagnosis of SARS-CoV-2 by FDA under an Emergency Use Authorization (EUA).  This EUA will remain in effect (meaning this test can be used) for the duration of the COVID-19 declaration under Section 564(b)(1) of the Act, 21 U.S.C. section 360bbb-3(b)(1), unless the authorization is terminated or revoked sooner. Performed at [facility], [address] [place], [place] [address]          Radiology Studies: Dg Chest 2 View  Result Date: 10/17/2018 CLINICAL DATA:  33 year old male  with history of shortness of breath. EXAM: CHEST - 2 VIEW COMPARISON:  Chest x-ray 10/16/2018. FINDINGS: Patchy multifocal airspace consolidation throughout the right lung, compatible with multilobar pneumonia. Left lung appears clear. No pleural effusions. No evidence of pulmonary edema. Heart size appears mildly enlarged. Upper mediastinal contours are within normal limits. IMPRESSION: 1. Multilobar pneumonia in the right lung. Overall, aeration is minimally improved compared with yesterday's examination. 2. Mild cardiomegaly, unchanged. Electronically Signed   By: Trudie Reedaniel  Entrikin M.D.   On: 10/17/2018 19:38        Scheduled Meds: . atorvastatin  40 mg Oral q1800  . doxycycline  100 mg Oral Q12H  . enoxaparin (LOVENOX) injection  40 mg Subcutaneous Q24H  . feeding supplement (ENSURE ENLIVE)  237 mL Oral BID BM  . influenza vac split quadrivalent PF  0.5 mL Intramuscular Tomorrow-1000  .  losartan  12.5 mg Oral Daily  . pneumococcal 23 valent vaccine  0.5 mL Intramuscular Tomorrow-1000  . sodium chloride flush  3 mL Intravenous Q12H  . spironolactone  12.5 mg Oral Daily   Continuous Infusions: . sodium chloride    . cefTRIAXone (ROCEPHIN)  IV Stopped (10/17/18 2315)     LOS: 0 days    Time spent: 39 minutes    Harold Hedge, DO [facility] Pager [phone]  If 7PM-7AM, please contact night-coverage [email or web address] Password [email or web address] 10/18/2018, 11:13 AM

## 2018-10-19 ENCOUNTER — Encounter (HOSPITAL_COMMUNITY)
Admission: EM | Disposition: A | Discharge status: Home / Self Care | Payer: Self-pay | Source: Home / Self Care | Attending: Internal Medicine

## 2018-10-19 ENCOUNTER — Inpatient Hospital Stay (HOSPITAL_COMMUNITY): Payer: Medicaid Other

## 2018-10-19 ENCOUNTER — Encounter (HOSPITAL_COMMUNITY): Payer: Self-pay | Admitting: Cardiology

## 2018-10-19 DIAGNOSIS — F419 Anxiety disorder, unspecified: Secondary | ICD-10-CM

## 2018-10-19 HISTORY — PX: RIGHT/LEFT HEART CATH AND CORONARY ANGIOGRAPHY: CATH118266

## 2018-10-19 LAB — POCT I-STAT EG7
Acid-Base Excess: 2 mmol/L (ref 0.0–2.0)
Bicarbonate: 27.4 mmol/L (ref 20.0–28.0)
Calcium, Ion: 1.22 mmol/L (ref 1.15–1.40)
HCT: 50 % (ref 39.0–52.0)
Hemoglobin: 17 g/dL (ref 13.0–17.0)
O2 Saturation: 67 %
Potassium: 3.7 mmol/L (ref 3.5–5.1)
Sodium: 141 mmol/L (ref 135–145)
TCO2: 29 mmol/L (ref 22–32)
pCO2, Ven: 45.7 mmHg (ref 44.0–60.0)
pH, Ven: 7.386 (ref 7.250–7.430)
pO2, Ven: 36 mmHg (ref 32.0–45.0)

## 2018-10-19 LAB — CBC
HCT: 52.9 % — ABNORMAL HIGH (ref 39.0–52.0)
Hemoglobin: 17.4 g/dL — ABNORMAL HIGH (ref 13.0–17.0)
MCH: 29.5 pg (ref 26.0–34.0)
MCHC: 32.9 g/dL (ref 30.0–36.0)
MCV: 89.8 fL (ref 80.0–100.0)
Platelets: 303 10*3/uL (ref 150–400)
RBC: 5.89 MIL/uL — ABNORMAL HIGH (ref 4.22–5.81)
RDW: 13.2 % (ref 11.5–15.5)
WBC: 8.9 10*3/uL (ref 4.0–10.5)
nRBC: 0 % (ref 0.0–0.2)

## 2018-10-19 LAB — BASIC METABOLIC PANEL
Anion gap: 11 (ref 5–15)
BUN: 10 mg/dL (ref 6–20)
CO2: 21 mmol/L — ABNORMAL LOW (ref 22–32)
Calcium: 8.9 mg/dL (ref 8.9–10.3)
Chloride: 106 mmol/L (ref 98–111)
Creatinine, Ser: 1.39 mg/dL — ABNORMAL HIGH (ref 0.61–1.24)
GFR calc Af Amer: >60 mL/min (ref >60)
GFR calc non Af Amer: >60 mL/min (ref >60)
Glucose, Bld: 99 mg/dL (ref 70–99)
Potassium: 4.1 mmol/L (ref 3.5–5.1)
Sodium: 138 mmol/L (ref 135–145)

## 2018-10-19 SURGERY — RIGHT/LEFT HEART CATH AND CORONARY ANGIOGRAPHY
Anesthesia: LOCAL

## 2018-10-19 MED ORDER — SPIRONOLACTONE 25 MG PO TABS
25.0000 mg | ORAL_TABLET | Freq: Every day | ORAL | Status: DC
  Administered 2018-10-20: 09:00:00 25 mg via ORAL
  Filled 2018-10-19 (×2): qty 1

## 2018-10-19 MED ORDER — HEPARIN (PORCINE) IN NACL 1000-0.9 UT/500ML-% IV SOLN
INTRAVENOUS | Status: AC
  Filled 2018-10-19: qty 1000

## 2018-10-19 MED ORDER — ENOXAPARIN SODIUM 40 MG/0.4ML ~~LOC~~ SOLN
40.0000 mg | SUBCUTANEOUS | Status: DC
  Filled 2018-10-19: qty 0.4

## 2018-10-19 MED ORDER — VERAPAMIL HCL 2.5 MG/ML IV SOLN
INTRAVENOUS | Status: DC | PRN
  Administered 2018-10-19: 13:00:00 10 mL via INTRA_ARTERIAL

## 2018-10-19 MED ORDER — SODIUM CHLORIDE 0.9% FLUSH: 3.0000 mL | INTRAVENOUS | Status: DC | PRN

## 2018-10-19 MED ORDER — SODIUM CHLORIDE 0.9 % IV SOLN: 250.0000 mL | INTRAVENOUS | Status: DC | PRN

## 2018-10-19 MED ORDER — ALPRAZOLAM 0.5 MG PO TABS
0.5000 mg | ORAL_TABLET | Freq: Once | ORAL | Status: AC
  Administered 2018-10-19: 0.5 mg via ORAL
  Filled 2018-10-19: qty 1

## 2018-10-19 MED ORDER — VERAPAMIL HCL 2.5 MG/ML IV SOLN
INTRAVENOUS | Status: AC
  Filled 2018-10-19: qty 2

## 2018-10-19 MED ORDER — LIDOCAINE HCL (PF) 1 % IJ SOLN
INTRAMUSCULAR | Status: DC | PRN
  Administered 2018-10-19 (×2): 2 mL

## 2018-10-19 MED ORDER — HEPARIN SODIUM (PORCINE) 1000 UNIT/ML IJ SOLN
INTRAMUSCULAR | Status: DC | PRN
  Administered 2018-10-19: 5000 [IU] via INTRAVENOUS

## 2018-10-19 MED ORDER — MIDAZOLAM HCL 2 MG/2ML IJ SOLN
INTRAMUSCULAR | Status: DC | PRN
  Administered 2018-10-19: 1 mg via INTRAVENOUS

## 2018-10-19 MED ORDER — LABETALOL HCL 5 MG/ML IV SOLN: 10.0000 mg | INTRAVENOUS | Status: AC | PRN

## 2018-10-19 MED ORDER — ACETAMINOPHEN 325 MG PO TABS: 650.0000 mg | ORAL_TABLET | ORAL | Status: DC | PRN

## 2018-10-19 MED ORDER — SACUBITRIL-VALSARTAN 24-26 MG PO TABS
1.0000 | ORAL_TABLET | Freq: Two times a day (BID) | ORAL | Status: DC
  Administered 2018-10-19 (×2): 1 via ORAL
  Filled 2018-10-19 (×2): qty 1

## 2018-10-19 MED ORDER — ONDANSETRON HCL 4 MG/2ML IJ SOLN: 4.0000 mg | Freq: Four times a day (QID) | INTRAMUSCULAR | Status: DC | PRN

## 2018-10-19 MED ORDER — NITROGLYCERIN 1 MG/10 ML FOR IR/CATH LAB
INTRA_ARTERIAL | Status: AC
  Filled 2018-10-19: qty 10

## 2018-10-19 MED ORDER — FENTANYL CITRATE (PF) 100 MCG/2ML IJ SOLN
INTRAMUSCULAR | Status: AC
  Filled 2018-10-19: qty 2

## 2018-10-19 MED ORDER — SODIUM CHLORIDE 0.9% FLUSH
3.0000 mL | Freq: Two times a day (BID) | INTRAVENOUS | Status: DC
  Administered 2018-10-19 (×2): 3 mL via INTRAVENOUS

## 2018-10-19 MED ORDER — BOOST / RESOURCE BREEZE PO LIQD CUSTOM
1.0000 | Freq: Three times a day (TID) | ORAL | Status: DC
  Administered 2018-10-19 – 2018-10-20 (×3): 1 via ORAL

## 2018-10-19 MED ORDER — ADULT MULTIVITAMIN W/MINERALS CH
1.0000 | ORAL_TABLET | Freq: Every day | ORAL | Status: DC
  Administered 2018-10-19 – 2018-10-20 (×2): 1 via ORAL
  Filled 2018-10-19 (×2): qty 1

## 2018-10-19 MED ORDER — HEPARIN (PORCINE) IN NACL 1000-0.9 UT/500ML-% IV SOLN
INTRAVENOUS | Status: DC | PRN
  Administered 2018-10-19 (×2): 500 mL

## 2018-10-19 MED ORDER — LIDOCAINE HCL (PF) 1 % IJ SOLN
INTRAMUSCULAR | Status: AC
  Filled 2018-10-19: qty 30

## 2018-10-19 MED ORDER — HEPARIN SODIUM (PORCINE) 1000 UNIT/ML IJ SOLN
INTRAMUSCULAR | Status: AC
  Filled 2018-10-19: qty 1

## 2018-10-19 MED ORDER — FENTANYL CITRATE (PF) 100 MCG/2ML IJ SOLN
INTRAMUSCULAR | Status: DC | PRN
  Administered 2018-10-19 (×2): 25 ug via INTRAVENOUS

## 2018-10-19 MED ORDER — NITROGLYCERIN 1 MG/10 ML FOR IR/CATH LAB
INTRA_ARTERIAL | Status: DC | PRN
  Administered 2018-10-19: 200 ug via INTRA_ARTERIAL

## 2018-10-19 MED ORDER — IOHEXOL 350 MG/ML SOLN
INTRAVENOUS | Status: DC | PRN
  Administered 2018-10-19: 14:00:00 50 mL

## 2018-10-19 MED ORDER — HYDRALAZINE HCL 20 MG/ML IJ SOLN: 10.0000 mg | INTRAMUSCULAR | Status: AC | PRN

## 2018-10-19 MED ORDER — ALPRAZOLAM 0.25 MG PO TABS: 0.2500 mg | ORAL_TABLET | Freq: Three times a day (TID) | ORAL | Status: DC | PRN

## 2018-10-19 MED ORDER — MIDAZOLAM HCL 2 MG/2ML IJ SOLN
INTRAMUSCULAR | Status: AC
  Filled 2018-10-19: qty 2

## 2018-10-19 SURGICAL SUPPLY — 14 items
CATH 5FR JL3.5 JR4 ANG PIG MP (CATHETERS) ×1 IMPLANT
CATH BALLN WEDGE 5F 110CM (CATHETERS) ×1 IMPLANT
DEVICE RAD COMP TR BAND LRG (VASCULAR PRODUCTS) ×1 IMPLANT
GLIDESHEATH SLEND SS 6F .021 (SHEATH) ×1 IMPLANT
GUIDEWIRE INQWIRE 1.5J.035X260 (WIRE) IMPLANT
INQWIRE 1.5J .035X260CM (WIRE) ×2
KIT HEART LEFT (KITS) ×1 IMPLANT
PACK CARDIAC CATHETERIZATION (CUSTOM PROCEDURE TRAY) ×2 IMPLANT
SHEATH GLIDE SLENDER 4/5FR (SHEATH) ×1 IMPLANT
SHEATH PROBE COVER 6X72 (BAG) ×1 IMPLANT
TRANSDUCER W/STOPCOCK (MISCELLANEOUS) ×2 IMPLANT
TUBING CIL FLEX 10 FLL-RA (TUBING) ×1 IMPLANT
WIRE EMERALD 3MM-J .025X260CM (WIRE) ×1 IMPLANT
WIRE HI TORQ VERSACORE-J 145CM (WIRE) ×1 IMPLANT

## 2018-10-19 NOTE — Progress Notes (Addendum)
Advanced Heart Failure Rounding Note  PCP-Cardiologist: Kirk Ruths, MD  Advanced HF: Dr. Aundra Dubin   Subjective:    Still SOB laying down. No CP. Appears anxious.   Objective:   Weight Range: 95.4 kg Body mass index is 30.19 kg/m.   Vital Signs:   Temp:  [97.5 F (36.4 C)-99.5 F (37.5 C)] 97.5 F (36.4 C) (10/05 0458) Pulse Rate:  [98-109] 98 (10/05 0458) Resp:  [18-20] 18 (10/05 0458) BP: (112-125)/(58-97) 125/79 (10/05 0458) SpO2:  [97 %-100 %] 100 % (10/05 0458) Weight:  [95.4 kg] 95.4 kg (10/05 0650) Last BM Date: 10/18/18  Weight change: Filed Weights   10/17/18 2244 10/18/18 0401 10/19/18 0650  Weight: 103.2 kg 101.3 kg 95.4 kg    Intake/Output:   Intake/Output Summary (Last 24 hours) at 10/19/2018 0726 Last data filed at 10/19/2018 0639 Gross per 24 hour  Intake 1309.89 ml  Output 1450 ml  Net -140.11 ml     Physical Exam    General:  Young AAM, WNWD, appears anxious. Increased WOB HEENT: Normal Neck: Supple. Elevated JVP . Carotids 2+ bilat; no bruits. No lymphadenopathy or thyromegaly appreciated. Cor: PMI nondisplaced. Regular rate & rhythm. No rubs, gallops or murmurs. Lungs: faint bibasilar crackles Abdomen: Soft, nontender, nondistended. No hepatosplenomegaly. No bruits or masses. Good bowel sounds. Extremities: No cyanosis, clubbing, rash, edema Neuro: Alert & orientedx3, cranial nerves grossly intact. moves all 4 extremities w/o difficulty. Affect pleasant   Telemetry   Sinus tach low 100s, no adverse arrhthymias   EKG    No new EKG to review today   Labs    CBC Recent Labs    10/17/18 1843  WBC 10.4  HGB 14.7  HCT 46.0  MCV 92.6  PLT 536   Basic Metabolic Panel Recent Labs    10/17/18 1843 10/18/18 0711 10/18/18 1215  NA 135 139  --   K 3.6 3.7  --   CL 101 105  --   CO2 22 24  --   GLUCOSE 103* 110*  --   BUN 9 11  --   CREATININE 1.40* 1.44*  --   CALCIUM 8.8* 8.7*  --   MG  --   --  2.2   Liver  Function Tests Recent Labs    10/18/18 1215  AST 30  ALT 43  ALKPHOS 51  BILITOT 0.7  PROT 7.0  ALBUMIN 3.1*   No results for input(s): LIPASE, AMYLASE in the last 72 hours. Cardiac Enzymes No results for input(s): CKTOTAL, CKMB, CKMBINDEX, TROPONINI in the last 72 hours.  BNP: BNP (last 3 results) Recent Labs    12/23/17 0022 10/16/18 0516 10/17/18 1843  BNP 236.1* 383.2* 313.0*    ProBNP (last 3 results) No results for input(s): PROBNP in the last 8760 hours.   D-Dimer No results for input(s): DDIMER in the last 72 hours. Hemoglobin A1C No results for input(s): HGBA1C in the last 72 hours. Fasting Lipid Panel No results for input(s): CHOL, HDL, LDLCALC, TRIG, CHOLHDL, LDLDIRECT in the last 72 hours. Thyroid Function Tests Recent Labs    10/18/18 1215  TSH 1.187    Other results:   Imaging     No results found.   Medications:     Scheduled Medications: . atorvastatin  40 mg Oral q1800  . digoxin  0.125 mg Oral Daily  . doxycycline  100 mg Oral Q12H  . enoxaparin (LOVENOX) injection  40 mg Subcutaneous Q24H  . feeding supplement (ENSURE ENLIVE)  237 mL Oral BID BM  . furosemide  80 mg Intravenous BID  . influenza vac split quadrivalent PF  0.5 mL Intramuscular Tomorrow-1000  . losartan  12.5 mg Oral BID  . pneumococcal 23 valent vaccine  0.5 mL Intramuscular Tomorrow-1000  . sodium chloride flush  3 mL Intravenous Q12H  . sodium chloride flush  3 mL Intravenous Q12H  . spironolactone  12.5 mg Oral Daily     Infusions: . sodium chloride Stopped (10/18/18 2338)  . sodium chloride    . sodium chloride 10 mL/hr at 10/19/18 0639  . cefTRIAXone (ROCEPHIN)  IV Stopped (10/18/18 2339)     PRN Medications:  sodium chloride, sodium chloride, acetaminophen, doxylamine (Sleep), ondansetron (ZOFRAN) IV, sodium chloride flush, sodium chloride flush    Patient Profile   33 y/o AAM w/ h/o cardiomyopathy of uncertain etiology diagnosed 12/2017 w/  EF at 20-25%, HTN, HLD and strong family history of premature CAD, admitted for acute on chronic systolic HF.   Assessment/Plan   1. Acute on chronic systolic CHF:  Cardiomyopathy of uncertain etiology.  Echo in 12/19 with EF 20-25%, echo this admission with EF 20% and moderate RV systolic dysfunction.  Given age, this seems most likely to be a viral cardiomyopathy, but his father had an MI at 90 and he has very high cholesterol (LDL 191 when last measured).  ECG with inferolateral TWIs.  On exam today, he remains volume overloaded w/ elevated JVD and faint bibasilar crackles and is still symptomatic/orthopneic.   -only 1.4L out yesterday.  Weights unreliable (recorded as loss from 223 lb>>210 lb in 24 hrs) - continue IV Lasix 80 mg bid -BMP pending  -May need metolazone if continued poor response to IV Lasix.  - Spironolactone 12.5 mg daily.  - Losartan to 12.5 mg bid>> transition eventually to Entresto if he has adequate BP.  - digoxin 0.125 daily.   - No beta blocker with volume overload and concern for low output.  - Plan right/left heart cath to assess cardiac output and also for definitive evaluation of CAD (given severe hyperlipidemia and family history).  If improvement/ resolution of orthopnea after dose of IV lasix this am, will plan to do ~noon today. If not improve, will plan [facility] tomorrow.    2. Hyperlipidemia: Restarted on atorvastatin.   3. Anxiety: will prescribe xanax 0.25 mg tid prn   Length of Stay: 1  Stephen Simmons, PA-C  10/19/2018, 7:26 AM  Advanced Heart Failure Team Pager [phone] (M-F; 7a - 4p)  Please contact [facility] for night-coverage after hours (4p -7a ) and weekends on [email or web address]  Patient seen with PA, agree with the above note.   He says that he urinated a lot last night and weight is down significantly, therefore not sure I/Os are accurate.  He is anxious.  No dyspnea unless he lies down flat.   General: NAD Neck: JVP 10 cm, no thyromegaly  or thyroid nodule.  Lungs: Clear to auscultation bilaterally with normal respiratory effort. CV: Nondisplaced PMI.  Heart regular S1/S2, no S3/S4, no murmur.  No peripheral edema.   Abdomen: Soft, nontender, no hepatosplenomegaly, no distention.  Skin: Intact without lesions or rashes.  Neurologic: Alert and oriented x 3.  Psych: Normal affect. Extremities: No clubbing or cyanosis.  HEENT: Normal.   Still looks like he is volume overloaded but improved from yesterday.  - Continue Lasix 80 mg IV bid for now.  Unfortunately still waiting on am labs.  - BP stable, stop  losartan and start Entresto.  - Continue spironolactone and digoxin.   Right/left heart cath today, discussed risks/benefits with patient and he agrees to procedure.  Has strong family history of premature CAD and very high LDL.   Stephen Ruiz 10/19/2018 8:58 AM    

## 2018-10-19 NOTE — H&P (View-Only) (Signed)
Advanced Heart Failure Rounding Note  PCP-Cardiologist: Kirk Ruths, MD  Advanced HF: Dr. Aundra Dubin   Subjective:    Still SOB laying down. No CP. Appears anxious.   Objective:   Weight Range: 95.4 kg Body mass index is 30.19 kg/m.   Vital Signs:   Temp:  [97.5 F (36.4 C)-99.5 F (37.5 C)] 97.5 F (36.4 C) (10/05 0458) Pulse Rate:  [98-109] 98 (10/05 0458) Resp:  [18-20] 18 (10/05 0458) BP: (112-125)/(58-97) 125/79 (10/05 0458) SpO2:  [97 %-100 %] 100 % (10/05 0458) Weight:  [95.4 kg] 95.4 kg (10/05 0650) Last BM Date: 10/18/18  Weight change: Filed Weights   10/17/18 2244 10/18/18 0401 10/19/18 0650  Weight: 103.2 kg 101.3 kg 95.4 kg    Intake/Output:   Intake/Output Summary (Last 24 hours) at 10/19/2018 0726 Last data filed at 10/19/2018 0639 Gross per 24 hour  Intake 1309.89 ml  Output 1450 ml  Net -140.11 ml     Physical Exam    General:  Young AAM, WNWD, appears anxious. Increased WOB HEENT: Normal Neck: Supple. Elevated JVP . Carotids 2+ bilat; no bruits. No lymphadenopathy or thyromegaly appreciated. Cor: PMI nondisplaced. Regular rate & rhythm. No rubs, gallops or murmurs. Lungs: faint bibasilar crackles Abdomen: Soft, nontender, nondistended. No hepatosplenomegaly. No bruits or masses. Good bowel sounds. Extremities: No cyanosis, clubbing, rash, edema Neuro: Alert & orientedx3, cranial nerves grossly intact. moves all 4 extremities w/o difficulty. Affect pleasant   Telemetry   Sinus tach low 100s, no adverse arrhthymias   EKG    No new EKG to review today   Labs    CBC Recent Labs    10/17/18 1843  WBC 10.4  HGB 14.7  HCT 46.0  MCV 92.6  PLT 536   Basic Metabolic Panel Recent Labs    10/17/18 1843 10/18/18 0711 10/18/18 1215  NA 135 139  --   K 3.6 3.7  --   CL 101 105  --   CO2 22 24  --   GLUCOSE 103* 110*  --   BUN 9 11  --   CREATININE 1.40* 1.44*  --   CALCIUM 8.8* 8.7*  --   MG  --   --  2.2   Liver  Function Tests Recent Labs    10/18/18 1215  AST 30  ALT 43  ALKPHOS 51  BILITOT 0.7  PROT 7.0  ALBUMIN 3.1*   No results for input(s): LIPASE, AMYLASE in the last 72 hours. Cardiac Enzymes No results for input(s): CKTOTAL, CKMB, CKMBINDEX, TROPONINI in the last 72 hours.  BNP: BNP (last 3 results) Recent Labs    12/23/17 0022 10/16/18 0516 10/17/18 1843  BNP 236.1* 383.2* 313.0*    ProBNP (last 3 results) No results for input(s): PROBNP in the last 8760 hours.   D-Dimer No results for input(s): DDIMER in the last 72 hours. Hemoglobin A1C No results for input(s): HGBA1C in the last 72 hours. Fasting Lipid Panel No results for input(s): CHOL, HDL, LDLCALC, TRIG, CHOLHDL, LDLDIRECT in the last 72 hours. Thyroid Function Tests Recent Labs    10/18/18 1215  TSH 1.187    Other results:   Imaging     No results found.   Medications:     Scheduled Medications: . atorvastatin  40 mg Oral q1800  . digoxin  0.125 mg Oral Daily  . doxycycline  100 mg Oral Q12H  . enoxaparin (LOVENOX) injection  40 mg Subcutaneous Q24H  . feeding supplement (ENSURE ENLIVE)  237 mL Oral BID BM  . furosemide  80 mg Intravenous BID  . influenza vac split quadrivalent PF  0.5 mL Intramuscular Tomorrow-1000  . losartan  12.5 mg Oral BID  . pneumococcal 23 valent vaccine  0.5 mL Intramuscular Tomorrow-1000  . sodium chloride flush  3 mL Intravenous Q12H  . sodium chloride flush  3 mL Intravenous Q12H  . spironolactone  12.5 mg Oral Daily     Infusions: . sodium chloride Stopped (10/18/18 2338)  . sodium chloride    . sodium chloride 10 mL/hr at 10/19/18 0639  . cefTRIAXone (ROCEPHIN)  IV Stopped (10/18/18 2339)     PRN Medications:  sodium chloride, sodium chloride, acetaminophen, doxylamine (Sleep), ondansetron (ZOFRAN) IV, sodium chloride flush, sodium chloride flush    Patient Profile   33 y/o AAM w/ h/o cardiomyopathy of uncertain etiology diagnosed 12/2017 w/  EF at 20-25%, HTN, HLD and strong family history of premature CAD, admitted for acute on chronic systolic HF.   Assessment/Plan   1. Acute on chronic systolic CHF:  Cardiomyopathy of uncertain etiology.  Echo in 12/19 with EF 20-25%, echo this admission with EF 20% and moderate RV systolic dysfunction.  Given age, this seems most likely to be a viral cardiomyopathy, but his father had an MI at 90 and he has very high cholesterol (LDL 191 when last measured).  ECG with inferolateral TWIs.  On exam today, he remains volume overloaded w/ elevated JVD and faint bibasilar crackles and is still symptomatic/orthopneic.   -only 1.4L out yesterday.  Weights unreliable (recorded as loss from 223 lb>>210 lb in 24 hrs) - continue IV Lasix 80 mg bid -BMP pending  -May need metolazone if continued poor response to IV Lasix.  - Spironolactone 12.5 mg daily.  - Losartan to 12.5 mg bid>> transition eventually to Entresto if he has adequate BP.  - digoxin 0.125 daily.   - No beta blocker with volume overload and concern for low output.  - Plan right/left heart cath to assess cardiac output and also for definitive evaluation of CAD (given severe hyperlipidemia and family history).  If improvement/ resolution of orthopnea after dose of IV lasix this am, will plan to do ~noon today. If not improve, will plan [facility] tomorrow.    2. Hyperlipidemia: Restarted on atorvastatin.   3. Anxiety: will prescribe xanax 0.25 mg tid prn   Length of Stay: 1  Brittainy Simmons, PA-C  10/19/2018, 7:26 AM  Advanced Heart Failure Team Pager [phone] (M-F; 7a - 4p)  Please contact [facility] for night-coverage after hours (4p -7a ) and weekends on [email or web address]  Patient seen with PA, agree with the above note.   He says that he urinated a lot last night and weight is down significantly, therefore not sure I/Os are accurate.  He is anxious.  No dyspnea unless he lies down flat.   General: NAD Neck: JVP 10 cm, no thyromegaly  or thyroid nodule.  Lungs: Clear to auscultation bilaterally with normal respiratory effort. CV: Nondisplaced PMI.  Heart regular S1/S2, no S3/S4, no murmur.  No peripheral edema.   Abdomen: Soft, nontender, no hepatosplenomegaly, no distention.  Skin: Intact without lesions or rashes.  Neurologic: Alert and oriented x 3.  Psych: Normal affect. Extremities: No clubbing or cyanosis.  HEENT: Normal.   Still looks like he is volume overloaded but improved from yesterday.  - Continue Lasix 80 mg IV bid for now.  Unfortunately still waiting on am labs.  - BP stable, stop  losartan and start Entresto.  - Continue spironolactone and digoxin.   Right/left heart cath today, discussed risks/benefits with patient and he agrees to procedure.  Has strong family history of premature CAD and very high LDL.   Marca Ancona 10/19/2018 8:58 AM

## 2018-10-19 NOTE — Progress Notes (Signed)
PROGRESS NOTE    Stephen Ruiz  YHC:[id number] DOB: 1985-08-15 DOA: 10/17/2018 PCP: Patient, No Pcp Per    Brief Narrative:  This is a 33 year old male with past medical history of NICM chronic systolic heart failure thought to be hypertensive (EF 20 -25%, follows with [name], PA), pretension, illicit drug use (cocaine, opiates, ecstasy), alcohol, hyperlipidemia who was seen on 9/22 with cough and shortness of breath, started on azithromycin however subsequently presented to [facility] yesterday with complaints of worsening dyspnea on exertion x2 weeks and sudden onset cough and shortness of breath while at rest as well as hemoptysis.  He was given Lasix for elevated BNP and started on CAP treatment for abnormal chest x-ray concerning for multifocal pneumonia (COVID negative).  Patient left [place] AMA and drove to [facility] morning of 10/4.  Found to have elevated troponin, mildly elevated BNP and white count and continued treatment for CAP and diuresed for heart failure.  Cardiology was consulted.  Underwent right and left heart cath 10/5.   Assessment & Plan:   Principal Problem:   Acute on chronic systolic CHF (congestive heart failure) (HCC) Active Problems:   Essential hypertension   NICM (nonischemic cardiomyopathy) ([place])   CAP (community acquired pneumonia)   Acute on chronic systolic congestive heart failure ([place])   Heart failure ([name])   1. Acute on chronic NICM systolic heart failure -follows with [name], PA (cardiology).  Diagnosed in 2019. Noncompliant with meds due to lack of insurance.  Father died at 29 of heart failure with MI in 47s. Has a history of cocaine and alcohol use in the past. Right and Left heart cath 10/5: No significant coronary disease, mildly elevated PCWP, low RA pressure, low cardiac output with cardiac index 1.81. 1. Cardiac MRI to evaluate for infiltrative process 2. Social work consulted for lack of health insurance leading to  medication noncompliance 3. Lasix 80 mg IV twice daily, Entresto, Spironolactone, digoxin 4. ICD candidate? 2. Elevated troponin - cath unremarkable 3. Prolonged QT -improved 1. Avoid QT prolonging agents 2. Discontinue azithromycin and switch to doxycycline for now 4. Hemoptysis with questionable CAP -sounds more like blood-tinged sputum as opposed to massive hemoptysis.  Clinically does not look sick and has negative procalcitonin has been afebrile. 1. Discontinue ceftriaxone and doxycycline 2. Follow-up QuantiFERON 3. Coags ordered 4. Consider rheumatologic work-up if this persists or worsens per bronchoscopy 5. Hypertension -stable 1. Plan as above 6. Hyperlipidemia  7. Elevated creatinine -improved with diuresis 8. Polysubstance use positive for THC   DVT prophylaxis: scd Code Status: Full code  Disposition Plan: pending medical stability.  Case management working on insurance.  Patient will need follow-up appointment at [facility] clinic [facility] will need to fill meds for patient and NCM will assist with management of her other meds   Consultants:   Cardiology   Procedures: echo 10/5 left and right heart cath   Antimicrobials: Ceftriaxone/ Azithromycin -> Ceftriaxone/Doxycycline 10/4-> discontinued 10/5   Subjective: Patient seen this morning resting in chair at bedside.  He had just recently received antianxiety medication as he was very nervous for his upcoming procedure in the afternoon.  Denied any chest pain or shortness of breath palpitations.  Objective: Vitals:   10/19/18 1430 10/19/18 1500 10/19/18 1525 10/19/18 1606  BP: 110/68 108/60 101/72 (!) 97/55  Pulse: 88 88 84   Resp: 18 18 (!) 21 18  Temp:    98 F (36.7 C)  TempSrc:    Oral  SpO2:    98%  Weight:      Height:        Intake/Output Summary (Last 24 hours) at 10/19/2018 1719 Last data filed at 10/19/2018 1200 Gross per 24 hour  Intake 1300.89 ml  Output 3150 ml  Net -1849.11 ml   Filed Weights    10/17/18 2244 10/18/18 0401 10/19/18 0650  Weight: 103.2 kg 101.3 kg 95.4 kg    Examination:  General exam: Muscular male appears calm and comfortable Respiratory system: Clear to auscultation. Respiratory effort normal. Cardiovascular system: S1 & S2 heard, RRR. No JVD, murmurs, rubs, gallops or clicks. No pedal edema.  No orthopnea Gastrointestinal system: Abdomen is nondistended, soft and nontender. No organomegaly or masses felt. Normal bowel sounds heard. Central nervous system: Alert and oriented. No focal neurological deficits. Extremities: Symmetric 5 x 5 power. Skin: No rashes, lesions or ulcers Psychiatry: Judgement and insight appear normal. Mood & affect appropriate.     Data Reviewed: I have personally reviewed following labs and imaging studies  CBC: Recent Labs  Lab 10/16/18 0502 10/16/18 0516 10/17/18 1843 10/19/18 0619  WBC  --  11.3* 10.4 8.9  NEUTROABS  --  8.9*  --   --   HGB 15.3 14.3 14.7 17.4*  HCT 45.0 45.7 46.0 52.9*  MCV  --  92.5 92.6 89.8  PLT  --  329 315 303   Basic Metabolic Panel: Recent Labs  Lab 10/16/18 0502 10/16/18 0516 10/17/18 1843 10/18/18 0711 10/18/18 1215 10/19/18 0816  NA 141 140 135 139  --  138  K 3.6 3.6 3.6 3.7  --  4.1  CL  --  105 101 105  --  106  CO2  --  24 22 24   --  21*  GLUCOSE  --  116* 103* 110*  --  99  BUN  --  11 9 11   --  10  CREATININE  --  1.29* 1.40* 1.44*  --  1.39*  CALCIUM  --  8.9 8.8* 8.7*  --  8.9  MG  --   --   --   --  2.2  --    GFR: Estimated Creatinine Clearance: 87.7 mL/min (A) (by C-G formula based on SCr of 1.39 mg/dL (H)). Liver Function Tests: Recent Labs  Lab 10/18/18 1215  AST 30  ALT 43  ALKPHOS 51  BILITOT 0.7  PROT 7.0  ALBUMIN 3.1*   No results for input(s): LIPASE, AMYLASE in the last 168 hours. No results for input(s): AMMONIA in the last 168 hours. Coagulation Profile: Recent Labs  Lab 10/18/18 1215  INR 1.2   Cardiac Enzymes: No results for input(s):  CKTOTAL, CKMB, CKMBINDEX, TROPONINI in the last 168 hours. BNP (last 3 results) No results for input(s): PROBNP in the last 8760 hours. HbA1C: No results for input(s): HGBA1C in the last 72 hours. CBG: No results for input(s): GLUCAP in the last 168 hours. Lipid Profile: No results for input(s): CHOL, HDL, LDLCALC, TRIG, CHOLHDL, LDLDIRECT in the last 72 hours. Thyroid Function Tests: Recent Labs    10/18/18 1215  TSH 1.187  FREET4 1.01   Anemia Panel: No results for input(s): VITAMINB12, FOLATE, FERRITIN, TIBC, IRON, RETICCTPCT in the last 72 hours. Sepsis Labs: Recent Labs  Lab 10/17/18 2040  PROCALCITON <0.10    Recent Results (from the past 240 hour(s))  SARS Coronavirus 2 [facility](Hospital order, Performed in [facility] hospital lab) Nasopharyngeal Nasopharyngeal Swab     Status: None   Collection  Time: 10/16/18  5:16 AM   Specimen: Nasopharyngeal Swab  Result Value Ref Range Status   SARS Coronavirus 2 NEGATIVE NEGATIVE Final    Comment: (NOTE) If result is NEGATIVE SARS-CoV-2 target nucleic acids are NOT DETECTED. The SARS-CoV-2 RNA is generally detectable in upper and lower  respiratory specimens during the acute phase of infection. The lowest  concentration of SARS-CoV-2 viral copies this assay can detect is 250  copies / mL. A negative result does not preclude SARS-CoV-2 infection  and should not be used as the sole basis for treatment or other  patient management decisions.  A negative result may occur with  improper specimen collection / handling, submission of specimen other  than nasopharyngeal swab, presence of viral mutation(s) within the  areas targeted by this assay, and inadequate number of viral copies  (<250 copies / mL). A negative result must be combined with clinical  observations, patient history, and epidemiological information. If result is POSITIVE SARS-CoV-2 target nucleic acids are DETECTED. The SARS-CoV-2 RNA is generally detectable in upper and  lower  respiratory specimens dur ing the acute phase of infection.  Positive  results are indicative of active infection with SARS-CoV-2.  Clinical  correlation with patient history and other diagnostic information is  necessary to determine patient infection status.  Positive results do  not rule out bacterial infection or co-infection with other viruses. If result is PRESUMPTIVE POSTIVE SARS-CoV-2 nucleic acids MAY BE PRESENT.   A presumptive positive result was obtained on the submitted specimen  and confirmed on repeat testing.  While 2019 novel coronavirus  (SARS-CoV-2) nucleic acids may be present in the submitted sample  additional confirmatory testing may be necessary for epidemiological  and / or clinical management purposes  to differentiate between  SARS-CoV-2 and other Sarbecovirus currently known to infect humans.  If clinically indicated additional testing with an alternate test  methodology [phone]) is advised. The SARS-CoV-2 RNA is generally  detectable in upper and lower respiratory sp ecimens during the acute  phase of infection. The expected result is Negative. Fact Sheet for Patients:  BoilerBrush.com.cy Fact Sheet for Healthcare Providers: https://pope.com/ This test is not yet approved or cleared by the [place] FDA and has been authorized for detection and/or diagnosis of SARS-CoV-2 by FDA under an Emergency Use Authorization (EUA).  This EUA will remain in effect (meaning this test can be used) for the duration of the COVID-19 declaration under Section 564(b)(1) of the Act, 21 U.S.C. section 360bbb-3(b)(1), unless the authorization is terminated or revoked sooner. Performed at [facility], [address] [place], [place] [address]   SARS Coronavirus 2 [facility] order, Performed in [facility] hospital lab) Nasopharyngeal Nasopharyngeal Swab     Status: None   Collection Time: 10/17/18  8:06 PM    Specimen: Nasopharyngeal Swab  Result Value Ref Range Status   SARS Coronavirus 2 NEGATIVE NEGATIVE Final    Comment: (NOTE) If result is NEGATIVE SARS-CoV-2 target nucleic acids are NOT DETECTED. The SARS-CoV-2 RNA is generally detectable in upper and lower  respiratory specimens during the acute phase of infection. The lowest  concentration of SARS-CoV-2 viral copies this assay can detect is 250  copies / mL. A negative result does not preclude SARS-CoV-2 infection  and should not be used as the sole basis for treatment or other  patient management decisions.  A negative result may occur with  improper specimen collection / handling, submission of specimen other  than nasopharyngeal swab, presence  of viral mutation(s) within the  areas targeted by this assay, and inadequate number of viral copies  (<250 copies / mL). A negative result must be combined with clinical  observations, patient history, and epidemiological information. If result is POSITIVE SARS-CoV-2 target nucleic acids are DETECTED. The SARS-CoV-2 RNA is generally detectable in upper and lower  respiratory specimens dur ing the acute phase of infection.  Positive  results are indicative of active infection with SARS-CoV-2.  Clinical  correlation with patient history and other diagnostic information is  necessary to determine patient infection status.  Positive results do  not rule out bacterial infection or co-infection with other viruses. If result is PRESUMPTIVE POSTIVE SARS-CoV-2 nucleic acids MAY BE PRESENT.   A presumptive positive result was obtained on the submitted specimen  and confirmed on repeat testing.  While 2019 novel coronavirus  (SARS-CoV-2) nucleic acids may be present in the submitted sample  additional confirmatory testing may be necessary for epidemiological  and / or clinical management purposes  to differentiate between  SARS-CoV-2 and other Sarbecovirus currently known to infect humans.  If  clinically indicated additional testing with an alternate test  methodology [phone]) is advised. The SARS-CoV-2 RNA is generally  detectable in upper and lower respiratory sp ecimens during the acute  phase of infection. The expected result is Negative. Fact Sheet for Patients:  BoilerBrush.com.cyhttps://www.fda.gov/media/136312/download Fact Sheet for Healthcare Providers: https://pope.com/https://www.fda.gov/media/136313/download This test is not yet approved or cleared by the [place] FDA and has been authorized for detection and/or diagnosis of SARS-CoV-2 by FDA under an Emergency Use Authorization (EUA).  This EUA will remain in effect (meaning this test can be used) for the duration of the COVID-19 declaration under Section 564(b)(1) of the Act, 21 U.S.C. section 360bbb-3(b)(1), unless the authorization is terminated or revoked sooner. Performed at [facility], [address] [place], [place] [address]          Radiology Studies: Dg Chest 2 View  Result Date: 10/17/2018 CLINICAL DATA:  33 year old male with history of shortness of breath. EXAM: CHEST - 2 VIEW COMPARISON:  Chest x-ray 10/16/2018. FINDINGS: Patchy multifocal airspace consolidation throughout the right lung, compatible with multilobar pneumonia. Left lung appears clear. No pleural effusions. No evidence of pulmonary edema. Heart size appears mildly enlarged. Upper mediastinal contours are within normal limits. IMPRESSION: 1. Multilobar pneumonia in the right lung. Overall, aeration is minimally improved compared with yesterday's examination. 2. Mild cardiomegaly, unchanged. Electronically Signed   By: Trudie Reedaniel  Entrikin M.D.   On: 10/17/2018 19:38        Scheduled Meds: . atorvastatin  40 mg Oral q1800  . digoxin  0.125 mg Oral Daily  . [START ON 10/20/2018] enoxaparin (LOVENOX) injection  40 mg Subcutaneous Q24H  . feeding supplement  1 Container Oral TID BM  . influenza vac split quadrivalent PF  0.5 mL Intramuscular  Tomorrow-1000  . multivitamin with minerals  1 tablet Oral Daily  . pneumococcal 23 valent vaccine  0.5 mL Intramuscular Tomorrow-1000  . sacubitril-valsartan  1 tablet Oral BID  . sodium chloride flush  3 mL Intravenous Q12H  . sodium chloride flush  3 mL Intravenous Q12H  . sodium chloride flush  3 mL Intravenous Q12H  . [START ON 10/20/2018] spironolactone  25 mg Oral Daily   Continuous Infusions: . sodium chloride Stopped (10/18/18 2338)  . sodium chloride       LOS: 1 day    Time spent: 35 minutes    Jilda PandaJared E  Dairl Ponder, DO [facility] Pager [phone]  If 7PM-7AM, please contact night-coverage [email or web address] Password [email or web address] 10/19/2018, 5:19 PM

## 2018-10-19 NOTE — Research (Signed)
PHDE Informed Consent   Subject Name: Stephen Ruiz  Subject met inclusion and exclusion criteria.  The informed consent form, study requirements and expectations were reviewed with the subject and questions and concerns were addressed prior to the signing of the consent form.  The subject verbalized understanding of the trail requirements.  The subject agreed to participate in the PHDE trial and signed the informed consent.  The informed consent was obtained prior to performance of any protocol-specific procedures for the subject.  A copy of the signed informed consent was given to the subject and a copy was placed in the subject's medical record.  Neva Seat 10/19/2018, 10:54 AM

## 2018-10-19 NOTE — Progress Notes (Signed)
Pt came down for scan and he stated he was severely claustrophobic, we tested him where he would be for the scan and pt became upset and said "no way, I am too claustrophobic to lay in here"   Dr Aundra Dubin notified and he said perhaps we could try a small dose of Xanax to be ordered and see if he will complete scan tomorrow.  Pt removed from scanner and sent back up to the floor.   Will check back tomorrow to see if pt is agreeable with plan.

## 2018-10-19 NOTE — Interval H&P Note (Signed)
History and Physical Interval Note:  10/19/2018 12:52 PM  Stephen Ruiz  has presented today for surgery, with the diagnosis of HF.  The various methods of treatment have been discussed with the patient and family. After consideration of risks, benefits and other options for treatment, the patient has consented to  Procedure(s): RIGHT/LEFT HEART CATH AND CORONARY ANGIOGRAPHY (N/A) as a surgical intervention.  The patient's history has been reviewed, patient examined, no change in status, stable for surgery.  I have reviewed the patient's chart and labs.  Questions were answered to the patient's satisfaction.     Vermon Grays [employer]

## 2018-10-19 NOTE — Progress Notes (Signed)
Initial Nutrition Assessment  DOCUMENTATION CODES:   Obesity unspecified  INTERVENTION:   -D/c Ensure Enlive po BID, each supplement provides 350 kcal and 20 grams of protein -Boost Breeze po TID, each supplement provides 250 kcal and 9 grams of protein -MVI with minerals daily -RD will follow for diet advancement and supplement diet as appropriate  NUTRITION DIAGNOSIS:   Increased nutrient needs related to chronic illness(CHF) as evidenced by estimated needs.  GOAL:   Patient will meet greater than or equal to 90% of their needs  MONITOR:   PO intake, Supplement acceptance, Labs, Weight trends, Skin, I & O's  REASON FOR ASSESSMENT:   Malnutrition Screening Tool    ASSESSMENT:   Stephen Ruiz is a 33 y.o. male with medical history significant of NICM EF 20-25% diagnosed end 2019.  Patient presented to the ED at [facility] yesterday with c/o persistent SOB.  He was seen 9/22 with cough and SOB, started on azithromycin.  Pt admitted with acute on chronic CHF.   Reviewed I/O's: -618 ml x 24 hours and -1.2 L since admission  UOP: 2.2 L x 24 hours  Pt being prepared to be transported to cath lab at time of visit. Unable to obtain further history from pt/ family at this time.   Case discussed with RN, who confirms pt going down for cath. She reports that pt with medication non-compliance secondary to inability to afford medications from lack of insurance. She shares that financial counselor came by earlier to assist with medicaid application. She reports pt has a good appetite, however, reports significant wt loss. RN expects that pt will remain in the hospital for a few days.   Reviewed meal completion records. Noted pt NPO for heart cath (advanced to clears after procedure). Pt was consuming 100% of meals on a heart healthy diet and was consuming Ensure supplements.   Reviewed wt hx; noted pt has experienced a 16.9% wt loss over the past 6 months, which is significant for  time frame. Given wt loss, pt would benefit from addition of nutritional supplements to improve nutritional status.  Labs reviewed.   Diet Order:   Diet Order            Diet clear liquid Room service appropriate? Yes; Fluid consistency: Thin  Diet effective now              EDUCATION NEEDS:   No education needs have been identified at this time  Skin:  Skin Assessment: Reviewed RN Assessment  Last BM:  10/19/18  Height:   Ht Readings from Last 1 Encounters:  10/19/18 5\' 10"  (1.778 m)    Weight:   Wt Readings from Last 1 Encounters:  10/19/18 95.4 kg    Ideal Body Weight:  75.5 kg  BMI:  Body mass index is 30.19 kg/m.  Estimated Nutritional Needs:   Kcal:  2200-2400  Protein:  110-125 grams  Fluid:  2 L    Taniah Reinecke A. Jimmye Norman, RD, LDN, [place] Registered Dietitian II Certified Diabetes Care and Education Specialist Pager: [phone] After hours Pager: [phone]

## 2018-10-19 NOTE — Progress Notes (Signed)
Patient came back to the floor from Cath lab at 1345 sleepy but respond to touch and  voice, and go back to sleep. Cath site bleed with first  3 cc air pulled, 3 cc air put back, pt. Denies pain. Will continue to monitor the patient

## 2018-10-19 NOTE — TOC Progression Note (Addendum)
Transition of Care [facility]) - Progression Note    Patient Details  Name: Stephen Ruiz MRN: [id number] Date of Birth: 1985-07-08  Transition of Care [facility]) CM/SW Contact  Zenon Mayo, RN Phone Number: 10/19/2018, 12:34 PM  Clinical Narrative:    From home with spouse,  Chest pain, sob, for cath today, EF 20-25%, on lasix iv , will be on entresto which is new for patient. He has no insurance listed or PCP.   He will need follow up apt at [facility] clinic.  He will need patient ast application, TOC will need to fill meds for patient, and NCM will also assist with Match for other meds.    10/6- patient for dc today, has follow up apt at [facility] clinic and the HF team will fill his medications and do patient ast for his entresto.          Expected Discharge Plan and Services                                                 Social Determinants of Health (SDOH) Interventions    Readmission Risk Interventions No flowsheet data found.

## 2018-10-20 ENCOUNTER — Inpatient Hospital Stay (HOSPITAL_COMMUNITY): Payer: Medicaid Other

## 2018-10-20 ENCOUNTER — Emergency Department (HOSPITAL_COMMUNITY): Admission: EM | Admit: 2018-10-20 | Discharge: 2018-10-20 | Discharge status: Absent without leave | Payer: MEDICAID

## 2018-10-20 LAB — BASIC METABOLIC PANEL
Anion gap: 12 (ref 5–15)
BUN: 9 mg/dL (ref 6–20)
CO2: 24 mmol/L (ref 22–32)
Calcium: 9.7 mg/dL (ref 8.9–10.3)
Chloride: 103 mmol/L (ref 98–111)
Creatinine, Ser: 1.26 mg/dL — ABNORMAL HIGH (ref 0.61–1.24)
GFR calc Af Amer: >60 mL/min (ref >60)
GFR calc non Af Amer: >60 mL/min (ref >60)
Glucose, Bld: 109 mg/dL — ABNORMAL HIGH (ref 70–99)
Potassium: 4.3 mmol/L (ref 3.5–5.1)
Sodium: 139 mmol/L (ref 135–145)

## 2018-10-20 LAB — CBC
HCT: 55.7 % — ABNORMAL HIGH (ref 39.0–52.0)
Hemoglobin: 18.2 g/dL — ABNORMAL HIGH (ref 13.0–17.0)
MCH: 29.3 pg (ref 26.0–34.0)
MCHC: 32.7 g/dL (ref 30.0–36.0)
MCV: 89.5 fL (ref 80.0–100.0)
Platelets: 412 10*3/uL — ABNORMAL HIGH (ref 150–400)
RBC: 6.22 MIL/uL — ABNORMAL HIGH (ref 4.22–5.81)
RDW: 13.3 % (ref 11.5–15.5)
WBC: 7.7 10*3/uL (ref 4.0–10.5)
nRBC: 0 % (ref 0.0–0.2)

## 2018-10-20 LAB — POCT I-STAT EG7
Acid-Base Excess: 1 mmol/L (ref 0.0–2.0)
Bicarbonate: 27.1 mmol/L (ref 20.0–28.0)
Calcium, Ion: 1.19 mmol/L (ref 1.15–1.40)
HCT: 50 % (ref 39.0–52.0)
Hemoglobin: 17 g/dL (ref 13.0–17.0)
O2 Saturation: 70 %
Potassium: 3.6 mmol/L (ref 3.5–5.1)
Sodium: 142 mmol/L (ref 135–145)
TCO2: 28 mmol/L (ref 22–32)
pCO2, Ven: 44.6 mmHg (ref 44.0–60.0)
pH, Ven: 7.391 (ref 7.250–7.430)
pO2, Ven: 37 mmHg (ref 32.0–45.0)

## 2018-10-20 LAB — QUANTIFERON-TB GOLD PLUS (RQFGPL)
QuantiFERON Mitogen Value: 9.89 IU/mL
QuantiFERON Nil Value: 0.02 IU/mL
QuantiFERON TB1 Ag Value: 0.02 IU/mL
QuantiFERON TB2 Ag Value: 0.02 IU/mL

## 2018-10-20 LAB — QUANTIFERON-TB GOLD PLUS: QuantiFERON-TB Gold Plus: NEGATIVE

## 2018-10-20 MED ORDER — SACUBITRIL-VALSARTAN 24-26 MG PO TABS: 1.0000 | ORAL_TABLET | Freq: Two times a day (BID) | ORAL | 6 refills | Status: DC

## 2018-10-20 MED ORDER — CARVEDILOL 3.125 MG PO TABS: 3.1250 mg | ORAL_TABLET | Freq: Two times a day (BID) | ORAL | Status: DC

## 2018-10-20 MED ORDER — SPIRONOLACTONE 25 MG PO TABS: 25.0000 mg | ORAL_TABLET | Freq: Every day | ORAL | 6 refills | Status: DC

## 2018-10-20 MED ORDER — POTASSIUM CHLORIDE CRYS ER 20 MEQ PO TBCR: 20.0000 meq | EXTENDED_RELEASE_TABLET | Freq: Every day | ORAL | 6 refills | Status: DC

## 2018-10-20 MED ORDER — DIGOXIN 125 MCG PO TABS: 0.1250 mg | ORAL_TABLET | Freq: Every day | ORAL | 6 refills | Status: DC

## 2018-10-20 MED ORDER — CARVEDILOL 3.125 MG PO TABS: 3.1250 mg | ORAL_TABLET | Freq: Two times a day (BID) | ORAL | 6 refills | Status: DC

## 2018-10-20 MED ORDER — POTASSIUM CHLORIDE CRYS ER 20 MEQ PO TBCR
20.0000 meq | EXTENDED_RELEASE_TABLET | Freq: Every day | ORAL | Status: DC
  Administered 2018-10-20: 20 meq via ORAL

## 2018-10-20 MED ORDER — ALPRAZOLAM 0.5 MG PO TABS
0.5000 mg | ORAL_TABLET | Freq: Once | ORAL | Status: AC
  Administered 2018-10-20: 0.5 mg via ORAL
  Filled 2018-10-20: qty 1

## 2018-10-20 MED ORDER — ATORVASTATIN CALCIUM 40 MG PO TABS: 40.0000 mg | ORAL_TABLET | Freq: Every day | ORAL | 6 refills | Status: DC

## 2018-10-20 MED ORDER — FUROSEMIDE 40 MG PO TABS: 40.0000 mg | ORAL_TABLET | Freq: Every day | ORAL | Status: DC

## 2018-10-20 MED ORDER — SACUBITRIL-VALSARTAN 24-26 MG PO TABS
1.0000 | ORAL_TABLET | Freq: Two times a day (BID) | ORAL | Status: DC
  Administered 2018-10-20: 1 via ORAL
  Filled 2018-10-20: qty 1

## 2018-10-20 MED ORDER — FUROSEMIDE 20 MG PO TABS
20.0000 mg | ORAL_TABLET | Freq: Every day | ORAL | Status: DC
  Administered 2018-10-20: 20 mg via ORAL
  Filled 2018-10-20: qty 1

## 2018-10-20 MED FILL — POTASSIUM CL ER 20 MEQ TABL: 20 | 30 days supply | Qty: 30 | Fill #0

## 2018-10-20 MED FILL — DIGOXIN 0.125 MG TABLET: 125 | 30 days supply | Qty: 30 | Fill #0

## 2018-10-20 MED FILL — ATORVASTATIN CALCIUM 40 MG: 40 | 30 days supply | Qty: 30 | Fill #0

## 2018-10-20 MED FILL — ENTRESTO 24 MG-26 MG TABLET: 24-26 | 30 days supply | Qty: 60 | Fill #0

## 2018-10-20 MED FILL — SPIRONOLACTONE 25 MG TABLET: 25 | 30 days supply | Qty: 30 | Fill #0

## 2018-10-20 MED FILL — CARVEDILOL 3.125 MG TABLET: 3.125 | 30 days supply | Qty: 60 | Fill #0

## 2018-10-20 NOTE — Progress Notes (Signed)
Patient ID: Stephen Ruiz, male   DOB: 18-Sep-1985, 33 y.o.   MRN: [id number]     Advanced Heart Failure Rounding Note  PCP-Cardiologist: Olga Millers, MD  Advanced HF: Dr. Shirlee Latch   Subjective:    No complaints this morning.  No dyspnea.  Was claustrophobic when he tried MRI yesterday.   RHC/LHC:  Coronary Findings  Diagnostic Dominance: Right Left Main  No significant coronary disease.  Left Anterior Descending  No significant coronary disease.  Left Circumflex  No significant coronary disease.  Right Coronary Artery  No significant coronary disease.  Intervention  No interventions have been documented. Right Heart  Right Heart Pressures RHC Procedural Findings: Hemodynamics (mmHg) RA mean 3 RV 32/1 PA 34/12, mean 23 PCWP mean 19 LV 88/20 AO 85/70  Oxygen saturations: PA 69% AO 100%  Cardiac Output (Fick) 3.96  Cardiac Index (Fick) 1.81     Objective:   Weight Range: 98.7 kg Body mass index is 31.22 kg/m.   Vital Signs:   Temp:  [97.6 F (36.4 C)-98 F (36.7 C)] 97.6 F (36.4 C) (10/06 0549) Pulse Rate:  [84-100] 100 (10/06 0549) Resp:  [9-30] 18 (10/06 0549) BP: (97-118)/(46-86) 115/75 (10/06 0549) SpO2:  [94 %-100 %] 97 % (10/06 0549) Weight:  [98.7 kg] 98.7 kg (10/06 0549) Last BM Date: 10/19/18  Weight change: Filed Weights   10/18/18 0401 10/19/18 0650 10/20/18 0549  Weight: 101.3 kg 95.4 kg 98.7 kg    Intake/Output:   Intake/Output Summary (Last 24 hours) at 10/20/2018 0821 Last data filed at 10/20/2018 0554 Gross per 24 hour  Intake 489 ml  Output 2500 ml  Net -2011 ml     Physical Exam    General: NAD Neck: No JVD, no thyromegaly or thyroid nodule.  Lungs: Clear to auscultation bilaterally with normal respiratory effort. CV: Nondisplaced PMI.  Heart regular S1/S2, no S3/S4, no murmur.  No peripheral edema.   Abdomen: Soft, nontender, no hepatosplenomegaly, no distention.  Skin: Intact without lesions or rashes.   Neurologic: Alert and oriented x 3.  Psych: Normal affect. Extremities: No clubbing or cyanosis.  HEENT: Normal.    Telemetry   NSR 90s, personally reviewed  EKG    No new EKG to review today   Labs    CBC Recent Labs    10/19/18 0619 10/19/18 1306 10/20/18 0542  WBC 8.9  --  7.7  HGB 17.4* 17.0 18.2*  HCT 52.9* 50.0 55.7*  MCV 89.8  --  89.5  PLT 303  --  412*   Basic Metabolic Panel Recent Labs    62/22/97 1215 10/19/18 0816 10/19/18 1306 10/20/18 0542  NA  --  138 141 139  K  --  4.1 3.7 4.3  CL  --  106  --  103  CO2  --  21*  --  24  GLUCOSE  --  99  --  109*  BUN  --  10  --  9  CREATININE  --  1.39*  --  1.26*  CALCIUM  --  8.9  --  9.7  MG 2.2  --   --   --    Liver Function Tests Recent Labs    10/18/18 1215  AST 30  ALT 43  ALKPHOS 51  BILITOT 0.7  PROT 7.0  ALBUMIN 3.1*   No results for input(s): LIPASE, AMYLASE in the last 72 hours. Cardiac Enzymes No results for input(s): CKTOTAL, CKMB, CKMBINDEX, TROPONINI in the last 72 hours.  BNP:  BNP (last 3 results) Recent Labs    12/23/17 0022 10/16/18 0516 10/17/18 1843  BNP 236.1* 383.2* 313.0*    ProBNP (last 3 results) No results for input(s): PROBNP in the last 8760 hours.   D-Dimer No results for input(s): DDIMER in the last 72 hours. Hemoglobin A1C No results for input(s): HGBA1C in the last 72 hours. Fasting Lipid Panel No results for input(s): CHOL, HDL, LDLCALC, TRIG, CHOLHDL, LDLDIRECT in the last 72 hours. Thyroid Function Tests Recent Labs    10/18/18 1215  TSH 1.187    Other results:   Imaging    No results found.   Medications:     Scheduled Medications: . ALPRAZolam  0.5 mg Oral Once  . atorvastatin  40 mg Oral q1800  . carvedilol  3.125 mg Oral BID WC  . digoxin  0.125 mg Oral Daily  . enoxaparin (LOVENOX) injection  40 mg Subcutaneous Q24H  . feeding supplement  1 Container Oral TID BM  . furosemide  20 mg Oral Daily  . influenza vac split  quadrivalent PF  0.5 mL Intramuscular Tomorrow-1000  . multivitamin with minerals  1 tablet Oral Daily  . pneumococcal 23 valent vaccine  0.5 mL Intramuscular Tomorrow-1000  . potassium chloride  20 mEq Oral Daily  . sacubitril-valsartan  1 tablet Oral BID  . sodium chloride flush  3 mL Intravenous Q12H  . sodium chloride flush  3 mL Intravenous Q12H  . sodium chloride flush  3 mL Intravenous Q12H  . spironolactone  25 mg Oral Daily    Infusions: . sodium chloride Stopped (10/18/18 2338)  . sodium chloride      PRN Medications: sodium chloride, sodium chloride, acetaminophen, ALPRAZolam, doxylamine (Sleep), ondansetron (ZOFRAN) IV, sodium chloride flush, sodium chloride flush    Patient Profile   33 y/o AAM w/ h/o cardiomyopathy of uncertain etiology diagnosed 12/2017 w/ EF at 20-25%, HTN, HLD and strong family history of premature CAD, admitted for acute on chronic systolic HF.   Assessment/Plan   1. Acute on chronic systolic CHF:  Nonischemic cardiomyopathy.  Echo in 12/19 with EF 20-25%, echo this admission with EF 20% and moderate RV systolic dysfunction.  RHC/LHC showed no significant coronary disease, mildly elevated PCWP, and low cardiac output (CI 1.8). Weight is down considerably now and he looks euvolemic on exam.  He feels much better.  - He thinks he may have had visual trouble with Entresto yesterday but BP was low with heavy sedation, higher today.  Will give Entresto 24/26 bid today, if he tolerates will go home on this.  If not, home on losartan 25 mg daily.  - Coreg 3.125 mg bid.  - Digoxin 0.125 daily.  - Spironolactone 25 mg daily.  - Start Lasix 20 mg daily.  If he cannot take Entresto, would send home on higher Lasix dose 40 mg daily.  - Needs close followup, low CO on RHC is concerning.    - To re-attempt cardiac MRI today with gentle sedation => ?CMP related to viral myocarditis or infiltrative disease.  2. Hyperlipidemia: Restarted on atorvastatin.  3.  Disposition: He can go home today after MRI.  Will need close followup in CHF clinic (10-14 days with me).  He will need assistance for [facility] and to work on getting Medicaid.  Meds for home: atorvastatin 40 mg daily, digoxin 0.125 daily, Entresto 24/26 bid (or losartan 25 mg daily if he does not tolerate this morning), Coreg 3.125 mg bid, spironolactone 25 mg daily, Lasix  20 mg daily (or Lasix 40 mg daily if he goes home on losartan, in which case would also give KCl 20 daily).   Length of Stay: 2  Marca Ancona, MD  10/20/2018, 8:21 AM  Advanced Heart Failure Team Pager [phone] (M-F; 7a - 4p)  Please contact [facility] for night-coverage after hours (4p -7a ) and weekends on [email or web address]

## 2018-10-20 NOTE — Progress Notes (Signed)
   Tolerated entresto without difficulty.  [place] for discharge per Dr Aundra Dubin. We will provide all medications through HF fund and give 30 days free care for entresto.   Plan to complete patient assistance for entresto at HF clinic follow up next week.   Murrell Elizondo NP-C  12:05 PM

## 2018-10-20 NOTE — Discharge Summary (Signed)
Advanced Heart Failure Team  Discharge Summary   Patient ID: Stephen Ruiz MRN: [id number], DOB/AGE: 34-06-1985 34 y.o. Admit date: 10/17/2018 D/C date:     10/20/2018   Primary Discharge Diagnoses:  1. Acute on chronic systolic CHF 2. Hyperlipidemia: Restarted on atorvastatin.  Hospital Course: 33 y/o AAM w/ h/o cardiomyopathy of uncertain etiology diagnosed 12/2017 w/ EF at 20-25%, HTN, HLD and strong family history of premature CAD, admitted for acute on chronic systolic HF.   Diuresed with lasix and transitioned to lasix 20 mg daily. Once diuresed he had RHC/LHC that showed no significant coronary disease and low cardiac output.  HF medications adjusted.   We will provide all medications through HF fund and give 30 days free card for entresto.  Plan to complete patient assistance for entresto at HF clinic follow up next week. We will continue to follow closely in the HF clinic.   See below for detailed problem list.   1. Acute on chronic systolic CHF: Nonischemic cardiomyopathy. Echo in 12/19 with EF 20-25%, echo this admission with EF 20% and moderate RV systolic dysfunction. RHC/LHC showed no significant coronary disease, mildly elevated PCWP, and low cardiac output (CI 1.8).  - Diuresed with IV lasix and transitioned to 20 mg po lasix daily.  - HF adjusted. Entresto 24/26 bid today - Coreg 3.125 mg bid.  - Digoxin 0.125 daily.  - Spironolactone 25 mg daily.  - Unable to obtain MRI.     2. Hyperlipidemia: Restarted on atorvastatin and will continue .   Discharge Weight: 217 pounds  Discharge Vitals: Blood pressure 118/72, pulse 97, temperature 97.6 F (36.4 C), temperature source Oral, resp. rate 16, height 5\' 10"  (1.778 m), weight 98.7 kg, SpO2 98 %.  Labs: Lab Results  Component Value Date   WBC 7.7 10/20/2018   HGB 18.2 (H) 10/20/2018   HCT 55.7 (H) 10/20/2018   MCV 89.5 10/20/2018   PLT 412 (H) 10/20/2018    Recent Labs  Lab 10/18/18 1215  10/20/18 0542   NA  --    < > 139  K  --    < > 4.3  CL  --    < > 103  CO2  --    < > 24  BUN  --    < > 9  CREATININE  --    < > 1.26*  CALCIUM  --    < > 9.7  PROT 7.0  --   --   BILITOT 0.7  --   --   ALKPHOS 51  --   --   ALT 43  --   --   AST 30  --   --   GLUCOSE  --    < > 109*   < > = values in this interval not displayed.   Lab Results  Component Value Date   CHOL 245 (H) 12/24/2017   HDL 28 (L) 12/24/2017   LDLCALC 191 (H) 12/24/2017   TRIG 129 12/24/2017   BNP (last 3 results) Recent Labs    12/23/17 0022 10/16/18 0516 10/17/18 1843  BNP 236.1* 383.2* 313.0*    ProBNP (last 3 results) No results for input(s): PROBNP in the last 8760 hours.   Diagnostic Studies/Procedures   RHC/LHC:  Coronary Findings  Diagnostic Dominance: Right Left Main  No significant coronary disease.  Left Anterior Descending  No significant coronary disease.  Left Circumflex  No significant coronary disease.  Right Coronary Artery  No significant coronary disease.  Intervention  No interventions have been documented. Right Heart  Right Heart Pressures RHC Procedural Findings: Hemodynamics (mmHg) RA mean 3 RV 32/1 PA 34/12, mean 23 PCWP mean 19 LV 88/20 AO 85/70  Oxygen saturations: PA 69% AO 100%  Cardiac Output (Fick) 3.96  Cardiac Index (Fick) 1.81     Discharge Medications   Allergies as of 10/20/2018   No Known Allergies     Medication List    STOP taking these medications   irbesartan 150 MG tablet Commonly known as: AVAPRO     TAKE these medications   atorvastatin 40 MG tablet Commonly known as: LIPITOR Take 1 tablet (40 mg total) by mouth daily at 6 PM.   carvedilol 3.125 MG tablet Commonly known as: COREG Take 1 tablet (3.125 mg total) by mouth 2 (two) times daily with a meal.   digoxin 0.125 MG tablet Commonly known as: LANOXIN Take 1 tablet (0.125 mg total) by mouth daily. Start taking on: October 21, 2018   furosemide 20 MG  tablet Commonly known as: LASIX Take 1 tablet (20 mg total) by mouth daily. Take additional 20 mg for weight gain of 2 pounds or if you develop shortness of breath.   potassium chloride SA 20 MEQ tablet Commonly known as: KLOR-CON Take 1 tablet (20 mEq total) by mouth daily. Start taking on: October 21, 2018   sacubitril-valsartan 24-26 MG Commonly known as: ENTRESTO Take 1 tablet by mouth 2 (two) times daily.   spironolactone 25 MG tablet Commonly known as: ALDACTONE Take 1 tablet (25 mg total) by mouth daily. Start taking on: October 21, 2018       Disposition   The patient will be discharged in stable condition to home. Discharge Instructions    Diet - low sodium heart healthy   Complete by: As directed    Increase activity slowly   Complete by: As directed      Follow-up [place]. Go on 10/27/2018.   Why: @3 :00pm Contact information: [place] [address] [phone]       Larey Dresser, MD Follow up on 10/29/2018.   Specialty: Cardiology Why: 8:40 Garage Code [phone]  Contact information: [address] [place] [address] [phone]             Duration of Discharge Encounter: Greater than 35 minutes   Signed, Darrick Grinder NP-C  10/20/2018, 11:53 AM

## 2018-10-20 NOTE — Progress Notes (Addendum)
PT complained feeling very sick in stomach, and throwing up. Saw yellow brown emesis on his gown.  Attempted to call MD come to check on him. But patient refused and stated that he just wants to go home, he did not want to wait. Notified MD, and MD said he will come by, but pt did not want to wait even 5 minutes.  Pt left the unit with his belongings via wheelchair. Discharged instruction was given to patient and family.

## 2018-10-20 NOTE — Progress Notes (Signed)
RN removed IV this morning prior to MRI. Patient did not want to have IV restarted. Offered to contact IV team for assistance and the patient declined. Dr Aundra Dubin notified. Exam cancelled per Dr Aundra Dubin.

## 2018-10-21 ENCOUNTER — Other Ambulatory Visit: Payer: Self-pay

## 2018-10-21 ENCOUNTER — Telehealth: Payer: Self-pay | Admitting: Physician Assistant

## 2018-10-21 ENCOUNTER — Encounter (HOSPITAL_BASED_OUTPATIENT_CLINIC_OR_DEPARTMENT_OTHER): Payer: Self-pay | Admitting: Emergency Medicine

## 2018-10-21 ENCOUNTER — Emergency Department (HOSPITAL_BASED_OUTPATIENT_CLINIC_OR_DEPARTMENT_OTHER)
Admission: EM | Admit: 2018-10-21 | Discharge: 2018-10-21 | Disposition: A | Discharge status: Home / Self Care | Payer: Medicaid Other | Attending: Emergency Medicine | Admitting: Emergency Medicine

## 2018-10-21 DIAGNOSIS — N289 Disorder of kidney and ureter, unspecified: Secondary | ICD-10-CM | POA: Diagnosis not present

## 2018-10-21 DIAGNOSIS — I5042 Chronic combined systolic (congestive) and diastolic (congestive) heart failure: Secondary | ICD-10-CM | POA: Diagnosis not present

## 2018-10-21 DIAGNOSIS — N182 Chronic kidney disease, stage 2 (mild): Secondary | ICD-10-CM | POA: Insufficient documentation

## 2018-10-21 DIAGNOSIS — R112 Nausea with vomiting, unspecified: Secondary | ICD-10-CM | POA: Diagnosis not present

## 2018-10-21 DIAGNOSIS — R1013 Epigastric pain: Secondary | ICD-10-CM | POA: Insufficient documentation

## 2018-10-21 DIAGNOSIS — Z79899 Other long term (current) drug therapy: Secondary | ICD-10-CM | POA: Insufficient documentation

## 2018-10-21 DIAGNOSIS — I130 Hypertensive heart and chronic kidney disease with heart failure and stage 1 through stage 4 chronic kidney disease, or unspecified chronic kidney disease: Secondary | ICD-10-CM | POA: Diagnosis not present

## 2018-10-21 DIAGNOSIS — F17290 Nicotine dependence, other tobacco product, uncomplicated: Secondary | ICD-10-CM | POA: Insufficient documentation

## 2018-10-21 LAB — COMPREHENSIVE METABOLIC PANEL
ALT: 54 U/L — ABNORMAL HIGH (ref 0–44)
AST: 54 U/L — ABNORMAL HIGH (ref 15–41)
Albumin: 3.8 g/dL (ref 3.5–5.0)
Alkaline Phosphatase: 52 U/L (ref 38–126)
Anion gap: 13 (ref 5–15)
BUN: 15 mg/dL (ref 6–20)
CO2: 26 mmol/L (ref 22–32)
Calcium: 9.6 mg/dL (ref 8.9–10.3)
Chloride: 97 mmol/L — ABNORMAL LOW (ref 98–111)
Creatinine, Ser: 1.42 mg/dL — ABNORMAL HIGH (ref 0.61–1.24)
GFR calc Af Amer: >60 mL/min (ref >60)
GFR calc non Af Amer: >60 mL/min (ref >60)
Glucose, Bld: 130 mg/dL — ABNORMAL HIGH (ref 70–99)
Potassium: 4.7 mmol/L (ref 3.5–5.1)
Sodium: 136 mmol/L (ref 135–145)
Total Bilirubin: 0.8 mg/dL (ref 0.3–1.2)
Total Protein: 8 g/dL (ref 6.5–8.1)

## 2018-10-21 LAB — CBC WITH DIFFERENTIAL/PLATELET
Abs Immature Granulocytes: 0.03 10*3/uL (ref 0.00–0.07)
Basophils Absolute: 0 10*3/uL (ref 0.0–0.1)
Basophils Relative: 0 %
Eosinophils Absolute: 0 10*3/uL (ref 0.0–0.5)
Eosinophils Relative: 0 %
HCT: 53.1 % — ABNORMAL HIGH (ref 39.0–52.0)
Hemoglobin: 16.9 g/dL (ref 13.0–17.0)
Immature Granulocytes: 0 %
Lymphocytes Relative: 10 %
Lymphs Abs: 1 10*3/uL (ref 0.7–4.0)
MCH: 28.7 pg (ref 26.0–34.0)
MCHC: 31.8 g/dL (ref 30.0–36.0)
MCV: 90.2 fL (ref 80.0–100.0)
Monocytes Absolute: 0.6 10*3/uL (ref 0.1–1.0)
Monocytes Relative: 5 %
Neutro Abs: 8.8 10*3/uL — ABNORMAL HIGH (ref 1.7–7.7)
Neutrophils Relative %: 85 %
Platelets: 382 10*3/uL (ref 150–400)
RBC: 5.89 MIL/uL — ABNORMAL HIGH (ref 4.22–5.81)
RDW: 12.8 % (ref 11.5–15.5)
WBC: 10.5 10*3/uL (ref 4.0–10.5)
nRBC: 0 % (ref 0.0–0.2)

## 2018-10-21 LAB — LIPASE, BLOOD: Lipase: 43 U/L (ref 11–51)

## 2018-10-21 MED ORDER — LIDOCAINE VISCOUS HCL 2 % MT SOLN
15.0000 mL | Freq: Once | OROMUCOSAL | Status: AC
  Administered 2018-10-21: 06:00:00 15 mL via ORAL
  Filled 2018-10-21: qty 15

## 2018-10-21 MED ORDER — ALUM & MAG HYDROXIDE-SIMETH 200-200-20 MG/5ML PO SUSP
30.0000 mL | Freq: Once | ORAL | Status: AC
  Administered 2018-10-21: 06:00:00 30 mL via ORAL
  Filled 2018-10-21: qty 30

## 2018-10-21 MED ORDER — ONDANSETRON 8 MG PO TBDP
ORAL_TABLET | ORAL | Status: AC
  Filled 2018-10-21: qty 1

## 2018-10-21 MED ORDER — ONDANSETRON HCL 4 MG/2ML IJ SOLN
4.0000 mg | Freq: Once | INTRAMUSCULAR | Status: AC
  Administered 2018-10-21: 04:00:00 4 mg via INTRAVENOUS
  Filled 2018-10-21: qty 2

## 2018-10-21 MED ORDER — ONDANSETRON HCL 4 MG PO TABS: 4.0000 mg | ORAL_TABLET | Freq: Four times a day (QID) | ORAL | 0 refills | Status: DC | PRN

## 2018-10-21 MED ORDER — SODIUM CHLORIDE 0.9 % IV BOLUS
500.0000 mL | Freq: Once | INTRAVENOUS | Status: AC
  Administered 2018-10-21: 04:00:00 500 mL via INTRAVENOUS

## 2018-10-21 MED ORDER — MORPHINE SULFATE (PF) 4 MG/ML IV SOLN
4.0000 mg | Freq: Once | INTRAVENOUS | Status: AC
  Administered 2018-10-21: 04:00:00 4 mg via INTRAVENOUS
  Filled 2018-10-21: qty 1

## 2018-10-21 MED ORDER — PANTOPRAZOLE SODIUM 40 MG PO TBEC: 40.0000 mg | DELAYED_RELEASE_TABLET | Freq: Every day | ORAL | 0 refills | Status: DC

## 2018-10-21 MED ORDER — ONDANSETRON 8 MG PO TBDP
8.0000 mg | ORAL_TABLET | Freq: Once | ORAL | Status: AC
  Administered 2018-10-21: 03:00:00 8 mg via ORAL

## 2018-10-21 MED ORDER — PANTOPRAZOLE SODIUM 40 MG PO TBEC
40.0000 mg | DELAYED_RELEASE_TABLET | Freq: Once | ORAL | Status: AC
  Administered 2018-10-21: 06:00:00 40 mg via ORAL
  Filled 2018-10-21: qty 1

## 2018-10-21 NOTE — Telephone Encounter (Signed)
Paged by answering service. Patient is constipated otherwise feeling well. Advised to take OTC laxative. He will call us if no improvement.
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
## 2018-10-21 NOTE — Discharge Instructions (Signed)
Return if symptoms are not being adequately controlled at home. °

## 2018-10-21 NOTE — ED Triage Notes (Signed)
Patient arrived via EMS c/o abdominal pain x 12 hrs post release from [facility]. Patient states he hasn't had a bowel movement in 12 hrs. Patient endorses nausea and emesis x 6 times today. Patient states he has taken gas ex and milk of magnesia at home to help. Patient is AO x 4, normal gait, VS WDL.
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
## 2018-10-26 ENCOUNTER — Other Ambulatory Visit (HOSPITAL_COMMUNITY): Payer: Self-pay | Admitting: *Deleted

## 2018-10-27 ENCOUNTER — Ambulatory Visit: Payer: Self-pay | Attending: Critical Care Medicine | Admitting: Critical Care Medicine

## 2018-10-27 ENCOUNTER — Encounter: Payer: Self-pay | Admitting: Critical Care Medicine

## 2018-10-27 ENCOUNTER — Other Ambulatory Visit: Payer: Self-pay

## 2018-10-27 VITALS — BP 106/68 | HR 94 | Temp 98.4°F | Ht 70.0 in | Wt 225.8 lb

## 2018-10-27 DIAGNOSIS — J189 Pneumonia, unspecified organism: Secondary | ICD-10-CM

## 2018-10-27 DIAGNOSIS — E785 Hyperlipidemia, unspecified: Secondary | ICD-10-CM

## 2018-10-27 DIAGNOSIS — G8929 Other chronic pain: Secondary | ICD-10-CM | POA: Insufficient documentation

## 2018-10-27 DIAGNOSIS — I5022 Chronic systolic (congestive) heart failure: Secondary | ICD-10-CM

## 2018-10-27 DIAGNOSIS — I428 Other cardiomyopathies: Secondary | ICD-10-CM

## 2018-10-27 DIAGNOSIS — J9601 Acute respiratory failure with hypoxia: Secondary | ICD-10-CM

## 2018-10-27 DIAGNOSIS — M545 Low back pain, unspecified: Secondary | ICD-10-CM

## 2018-10-27 DIAGNOSIS — I5041 Acute combined systolic (congestive) and diastolic (congestive) heart failure: Secondary | ICD-10-CM

## 2018-10-27 DIAGNOSIS — I10 Essential (primary) hypertension: Secondary | ICD-10-CM

## 2018-10-27 MED ORDER — GABAPENTIN 300 MG PO CAPS: 300.0000 mg | ORAL_CAPSULE | Freq: Three times a day (TID) | ORAL | 3 refills | Status: DC

## 2018-10-27 MED FILL — GABAPENTIN 300 MG CAPSULE: 300 | 30 days supply | Qty: 90 | Fill #0

## 2018-10-27 NOTE — Assessment & Plan Note (Signed)
Acute respiratory failure with hypoxemia due to pulmonary edema now resolved

## 2018-10-27 NOTE — Assessment & Plan Note (Signed)
Cardiac cath was normal  Cardiology considering cardiac MRI

## 2018-10-27 NOTE — Assessment & Plan Note (Signed)
Chronic bilateral low back pain without sciatica  We will prescribe gabapentin 300 mg 3 times daily and obtain imaging studies of the lumbar spine and likely consider referral to orthopedic spine once patient obtains financial assistance

## 2018-10-27 NOTE — Assessment & Plan Note (Signed)
Question of pneumonia during the last visit however this did not evolve and the patient actually did not have pneumonia

## 2018-10-27 NOTE — Patient Instructions (Signed)
No change in heart medicines for now please keep your heart visit appointment  Labs today include metabolic panel and complete blood counts and digoxin level  Begin gabapentin 300 mg 3 times a day for your back pain  An x-ray of your lower back will be obtained and we may consider physical therapy consult we can also give a referral to orthopedic spine or a chronic pain management clinic if necessary  Return in follow-up for Dr. Joya Gaskins 2 months

## 2018-10-27 NOTE — Assessment & Plan Note (Signed)
Acute on chronic systolic heart failure now better compensated  Ejection fraction 20% noted  Plan per cardiology with further medication titration and I did encourage the patient to keep his upcoming cardiology appointment this week  I did obtain today a metabolic panel and blood count along with digoxin level  Cardiology to determine whether MRI will be obtained

## 2018-10-27 NOTE — Assessment & Plan Note (Signed)
Previous history of hypertension currently the patient is well compensated with current medication profile per cardiology

## 2018-10-27 NOTE — Assessment & Plan Note (Signed)
Dyslipidemia now on atorvastatin 40 mg daily
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
## 2018-10-28 LAB — CBC
Hematocrit: 43.9 % (ref 37.5–51.0)
Hemoglobin: 14.4 g/dL (ref 13.0–17.7)
MCH: 28.7 pg (ref 26.6–33.0)
MCHC: 32.8 g/dL (ref 31.5–35.7)
MCV: 88 fL (ref 79–97)
Platelets: 324 10*3/uL (ref 150–450)
RBC: 5.01 x10E6/uL (ref 4.14–5.80)
RDW: 12.2 % (ref 11.6–15.4)
WBC: 6.1 10*3/uL (ref 3.4–10.8)

## 2018-10-28 LAB — BASIC METABOLIC PANEL WITH GFR
BUN/Creatinine Ratio: 9 (ref 9–20)
BUN: 11 mg/dL (ref 6–20)
CO2: 23 mmol/L (ref 20–29)
Calcium: 10 mg/dL (ref 8.7–10.2)
Chloride: 104 mmol/L (ref 96–106)
Creatinine, Ser: 1.22 mg/dL (ref 0.76–1.27)
GFR calc Af Amer: 89 mL/min/{1.73_m2}
GFR calc non Af Amer: 77 mL/min/{1.73_m2}
Glucose: 89 mg/dL (ref 65–99)
Potassium: 4.8 mmol/L (ref 3.5–5.2)
Sodium: 141 mmol/L (ref 134–144)

## 2018-10-28 LAB — DIGOXIN LEVEL: Digoxin, Serum: 0.6 ng/mL (ref 0.5–0.9)

## 2018-10-29 ENCOUNTER — Encounter (HOSPITAL_COMMUNITY): Payer: Self-pay | Admitting: Cardiology

## 2018-10-29 ENCOUNTER — Telehealth (HOSPITAL_COMMUNITY): Payer: Self-pay

## 2018-10-29 NOTE — Telephone Encounter (Signed)
Called patients wife regarding missed appt this morning. Pts wife state that patient went back to work and could not miss any more days.  Pt rescheduled for end of the month for later in the afternoon to accommodate work schedule. Wife appreciative.

## 2018-11-06 ENCOUNTER — Ambulatory Visit: Payer: Self-pay | Admitting: Cardiology

## 2018-11-13 ENCOUNTER — Other Ambulatory Visit: Payer: Self-pay

## 2018-11-13 ENCOUNTER — Ambulatory Visit (HOSPITAL_COMMUNITY)
Admission: RE | Admit: 2018-11-13 | Discharge: 2018-11-13 | Disposition: A | Discharge status: Home / Self Care | Payer: Self-pay | Source: Ambulatory Visit | Attending: Cardiology | Admitting: Cardiology

## 2018-11-13 ENCOUNTER — Encounter (HOSPITAL_COMMUNITY): Payer: Self-pay | Admitting: Cardiology

## 2018-11-13 VITALS — BP 116/60 | HR 91 | Wt 228.6 lb

## 2018-11-13 DIAGNOSIS — I110 Hypertensive heart disease with heart failure: Secondary | ICD-10-CM | POA: Insufficient documentation

## 2018-11-13 DIAGNOSIS — Z8249 Family history of ischemic heart disease and other diseases of the circulatory system: Secondary | ICD-10-CM | POA: Insufficient documentation

## 2018-11-13 DIAGNOSIS — Z7901 Long term (current) use of anticoagulants: Secondary | ICD-10-CM | POA: Insufficient documentation

## 2018-11-13 DIAGNOSIS — E785 Hyperlipidemia, unspecified: Secondary | ICD-10-CM | POA: Insufficient documentation

## 2018-11-13 DIAGNOSIS — I428 Other cardiomyopathies: Secondary | ICD-10-CM

## 2018-11-13 DIAGNOSIS — Z79899 Other long term (current) drug therapy: Secondary | ICD-10-CM | POA: Insufficient documentation

## 2018-11-13 DIAGNOSIS — I5022 Chronic systolic (congestive) heart failure: Secondary | ICD-10-CM

## 2018-11-13 MED ORDER — CARVEDILOL 6.25 MG PO TABS: 6.2500 mg | ORAL_TABLET | Freq: Two times a day (BID) | ORAL | 5 refills | Status: DC

## 2018-11-13 MED ORDER — SACUBITRIL-VALSARTAN 24-26 MG PO TABS: 1.0000 | ORAL_TABLET | Freq: Two times a day (BID) | ORAL | 6 refills | Status: DC

## 2018-11-13 NOTE — Patient Instructions (Signed)
No lab work done today.  INCREASE Carvedilol 6.25(1 tab) by mouth two times daily.  Please continue all other medications as prescribed.   Your physician recommends that you schedule a follow-up appointment in: 3 weeks with the Pharmacy clinic(bloodwork will be done) and in 6 weeks with Dr. Aundra Dubin  At the [facility], you and your health needs are our priority. As part of our continuing mission to provide you with exceptional heart care, we have created designated Provider Care Teams. These Care Teams include your primary Cardiologist (physician) and Advanced Practice Providers (APPs- Physician Assistants and Nurse Practitioners) who all work together to provide you with the care you need, when you need it.   You may see any of the following providers on your designated Care Team at your next follow up: Marland Kitchen Dr Glori Bickers . Dr Loralie Champagne . Darrick Grinder, NP . Lyda Jester, PA   Please be sure to bring in all your medications bottles to every appointment.
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
## 2018-12-01 ENCOUNTER — Telehealth (HOSPITAL_COMMUNITY): Payer: Self-pay | Admitting: Licensed Clinical Social Worker

## 2018-12-01 NOTE — Telephone Encounter (Addendum)
CSW consulted to help with pt medication concerns.  First confirmed with pt that he is being seen at [facility] and that he was able to get his medications through them previously at reduced cost as patient has no insurance coverage.  CSW informed clinic CMA who will fill refills to [facility] for patient.  CSW then inquired about Delene Loll as CSW noticed it was on his med list and no notes regarding patient assistance found in his chart.  Pt states that he has no coverage or assistance for this medication and is unable to afford it- had been taken off this medication before because he can't afford but was put back on after recent hospital stay.  CSW emailed pt application for [employer] to complete and return to [place] also placed one at the front desk with Entresto samples as pt reports he is out.  CSW then inquired about pt current insurance status.  Pt reports that he believes he has a pending Medicaid application that was completed during hospital stay- CSW reviewed financial counseling notes but did not see any supporting notes regarding medicaid- Educational psychologist for pt to inquire and they confirmed that they did submit application and that April McGill [phone]) is pt DSS case worker.  CSW will continue to follow and assist as needed  Jorge Ny, [facility] Desk#: [phone] Cell#: [phone]

## 2018-12-02 ENCOUNTER — Other Ambulatory Visit (HOSPITAL_COMMUNITY): Payer: Self-pay | Admitting: Cardiology

## 2018-12-04 ENCOUNTER — Telehealth (HOSPITAL_COMMUNITY): Payer: Self-pay | Admitting: Licensed Clinical Social Worker

## 2018-12-04 NOTE — Progress Notes (Signed)
PCP: Patient, No Pcp Per Cardiology: Dr. Aundra Dubin  HPI:  33 y.o. with history of nonischemic cardiomyopathy, HTN, and hyperlipidemia presents for followup of CHF.  Patient developed dyspnea initially in 12/19. He had several weeks of progressive exertional dyspnea and was admitted with CHF. Echo at that time showed EF 20-25%. He was seen by Dr. Stanford Breed and started on meds for cardiomyopathy. There were plans for outpatient evaluation with coronary CTA and cardiac MRI, but these were never done. Patient had 1 followup by telehealth to get his meds refilled but otherwise was not seen after the 12/19 admission. He says that he felt better initially and finally decided to stop taking the medications. He was restarted on meds after his telehealth visit, but not sure he actually took them.   He was re-admitted with CHF in 10/20. Echo this admission with EF 20%, moderate RV systolic dysfunction. He was diuresed.  LHC/RHC after diuresis showed no obstructive CAD, mildly elevated PCWP, decreased cardiac output (CI 1.8).  He was unable to tolerate cardiac MRI due to claustrophobia.   Rare ETOH, no cocaine, no tobacco, occasional THC. His father had his first MI at 23, died from CHF/CAD in his early 88s. Grandmother with CHF.   He recently presented to HF Clinic on 11/13/18 with Dr. Aundra Dubin. At that visit he reported that he had been doing well since discharge.  Tolerating meds without lightheadedness.  No significant exertional dyspnea.  No chest pain. No orthopnea/PND.  Today he returns to HF clinic for pharmacist medication titration. At last visit with MD, carvedilol was increased to 6.25 mg BID. Noted significant noncompliance with medications. He has been out of digoxin for at least 1 week and potassium and spironolactone for 2-3 days. Unclear if out of Entresto or not. He had not called for refills on any of his meds because he was concerned about the cost of Entresto. Social work has been  trying to get him to [employer] and he never came in to do that. I had him sign paperwork today and will get it sent in. I also transferred all his medications over to the [facility] so that he could get them through the HF fund. Hopefully this will help compliance. Otherwise he did report feeling well today. Does note dizziness 20 minutes after taking his medications, which he attributes to the [facility]. We discussed taking his medications with meals. He also complained of fatigue after taking his meds. His breathing is better but he still gets SOB with mild-moderate activity. He can complete all ADLs. He does weigh himself daily and his weight has been stable at 225-228 lbs. He takes furosemide 20 mg daily and uses an extra tablet about 1-2 times per week. Sleeping on 2 pillows at night which is improved from 3. No more PND, no LEE. His appetite is fine. He is using less salt but not strictly following a low salt diet. He also does not watch his fluid intake.     Marland Kitchen Shortness of breath/dyspnea on exertion? yes  . Orthopnea/PND? 2 pillow orthopnea, which is improved. Less PND. Marland Kitchen Edema? no . Lightheadedness/dizziness? Yes - noted 20 minutes after morning meds . Daily weights at home? yes . Blood pressure/heart rate monitoring at home? yes . Following low-sodium/fluid-restricted diet? No - we discussed the importance of a low-sodium diet and fluid restriction today.   HF Medications: Carvedilol 6.25 mg BID Entresto 24/26 mg BID Spironolactone 25 mg daily Digoxin 0.125 mg  daily Furosemide 20 daily Potassium 20 mEq daily  Has the patient been experiencing any side effects to the medications prescribed?  Yes - dizziness with Entresto  Does the patient have any problems obtaining medications due to transportation or finances?   Yes - no prescription insurance. I transferred all his medications to the [facility] today. He will use  HF fund. In process of applying for Manufacturer's Assistance for [place].   Understanding of regimen: fair Understanding of indications: good Potential of compliance: fair - Significant noncompliance noted today. Could benefit from frequent re-education on the importance of his HF regimen.  Patient understands to avoid NSAIDs. Patient understands to avoid decongestants.    Pertinent Lab Values: . Serum creatinine 1.23, BUN 8, Potassium 3.8, Sodium 136, BNP 72.3 pg/mL, Digoxin <0.2 ng/mL  Vital Signs: . Weight: 227.8 lbs (last clinic weight: 228.6 lbs) . Blood pressure: 130/78  . Heart rate: 77   Assessment: 1. Chronic systolic XBM:[id number] cardiomyopathy.  No history of ETOH or cocaine abuse. Multiple family members with CHF. Echo in 12/19 with EF 20-25%, echo 10/20 with EF 20% and moderate RV systolic dysfunction.RHC/LHC in 10/20 showed no significant coronary disease, mildly elevated PCWP, and low cardiac output (CI 1.8).  He was unable to tolerate cardiac MRI.  - NYHA class II symptoms.  Significant medication noncompliance as noted above. -  Euvolemic on exam.  - Labs: Scr stable at 1.23, K 3.8. Of note had been out of potassium chloride for 2-3 days. May be able to discontinue at next visit pending labs. BNP 72.3 pg/mL (down from 313 pg/mL on 10/17/18) - Continue Lasix 20 daily.  - Continue Carvedilol 6.25 mg BID. Will not increase at this time given complaints of fatigue.    - Continue Entresto 24/26 mg BID. Patient does not want to increase at this time due to dizziness. Will keep at lower dose for now. Reassess at next visit. Working on Engineer, materials.  - Continue Spironolactone 25 mg daily. - Digoxin 0.125 daily. Level today is <0.2 ng/mL; however, he has been out of the medication for about 1 week so it is not a true trough. Repeat dig level at next visit.  - Start hydralazine 12.5 mg BID. He refused TID dosing, so will try BID for now.  Starting low dose given his concern for dizziness.  - Start imdur 15 mg daily. If tolerates hydralazine and isosorbide mononitrate, would consider applying for Bidil manufacturer's assistance.  - Needs close followup, low CO on RHC is concerning.  Would benefit from continued education given noncompliance.  - Will need repeat echo in 4 more months after he has been on a good medical regimen.  If EF remains low, will need to consider ICD.  He is not CRT candidate.  - Invitae gene testing sent today given family history of CHF. He has 3 young children.  2. Hyperlipidemia:  -Lipid panel 12/2017: LDL 191, HDL 28, TG 129 -Continue atorvastatin 40 mg daily   Plan: 1) Medication changes: Based on clinical presentation, vital signs and recent labs will start hydralazine 12.5 mg BID and imdur 15 mg daily. Starting low dose given concerns of dizziness (and refusal of TID dosing). Will obtain medications through HF fund. Working on [employer] for [employer].  2) Labs: Scr 1.23, K 3.8 3) Follow-up: 2 weeks with Dr. Jonn Shingles, PharmD, BCPS, [facility], CPP Heart Failure Clinic Pharmacist [phone]

## 2018-12-04 NOTE — Telephone Encounter (Signed)
CSW noticed that pt Entresto samples and [employer] application were still at the front desk.  CSW called pt to inform and remind to come pick them up as he has been out of Entresto for several days now.  Pt reports that he will pick up this afternoon.  CSW will continue to follow and assist as needed  Jorge Ny, [facility] Desk#: [phone] Cell#: [phone]

## 2018-12-09 ENCOUNTER — Encounter: Payer: Self-pay | Admitting: Cardiology

## 2018-12-09 ENCOUNTER — Ambulatory Visit (HOSPITAL_COMMUNITY)
Admission: RE | Admit: 2018-12-09 | Discharge: 2018-12-09 | Disposition: A | Discharge status: Home / Self Care | Payer: Self-pay | Source: Ambulatory Visit | Attending: Cardiology | Admitting: Cardiology

## 2018-12-09 ENCOUNTER — Telehealth (HOSPITAL_COMMUNITY): Payer: Self-pay | Admitting: Pharmacist

## 2018-12-09 ENCOUNTER — Other Ambulatory Visit: Payer: Self-pay

## 2018-12-09 VITALS — BP 130/78 | HR 77 | Wt 227.8 lb

## 2018-12-09 DIAGNOSIS — I110 Hypertensive heart disease with heart failure: Secondary | ICD-10-CM | POA: Insufficient documentation

## 2018-12-09 DIAGNOSIS — Z9114 Patient's other noncompliance with medication regimen: Secondary | ICD-10-CM | POA: Insufficient documentation

## 2018-12-09 DIAGNOSIS — I428 Other cardiomyopathies: Secondary | ICD-10-CM | POA: Insufficient documentation

## 2018-12-09 DIAGNOSIS — E785 Hyperlipidemia, unspecified: Secondary | ICD-10-CM | POA: Insufficient documentation

## 2018-12-09 DIAGNOSIS — I5022 Chronic systolic (congestive) heart failure: Secondary | ICD-10-CM | POA: Insufficient documentation

## 2018-12-09 DIAGNOSIS — Z79899 Other long term (current) drug therapy: Secondary | ICD-10-CM | POA: Insufficient documentation

## 2018-12-09 LAB — BASIC METABOLIC PANEL
Anion gap: 7 (ref 5–15)
BUN: 8 mg/dL (ref 6–20)
CO2: 24 mmol/L (ref 22–32)
Calcium: 9.2 mg/dL (ref 8.9–10.3)
Chloride: 105 mmol/L (ref 98–111)
Creatinine, Ser: 1.23 mg/dL (ref 0.61–1.24)
GFR calc Af Amer: >60 mL/min (ref >60)
GFR calc non Af Amer: >60 mL/min (ref >60)
Glucose, Bld: 127 mg/dL — ABNORMAL HIGH (ref 70–99)
Potassium: 3.8 mmol/L (ref 3.5–5.1)
Sodium: 136 mmol/L (ref 135–145)

## 2018-12-09 LAB — DIGOXIN LEVEL: Digoxin Level: <0.2 ng/mL — ABNORMAL LOW (ref 0.8–2.0)

## 2018-12-09 LAB — BRAIN NATRIURETIC PEPTIDE: B Natriuretic Peptide: 72.3 pg/mL (ref 0.0–100.0)

## 2018-12-09 MED ORDER — ISOSORBIDE MONONITRATE ER 30 MG PO TB24: 15.0000 mg | ORAL_TABLET | Freq: Every day | ORAL | 6 refills | Status: DC

## 2018-12-09 MED ORDER — CARVEDILOL 6.25 MG PO TABS: 6.2500 mg | ORAL_TABLET | Freq: Two times a day (BID) | ORAL | 5 refills | Status: DC

## 2018-12-09 MED ORDER — HYDRALAZINE HCL 25 MG PO TABS: 12.5000 mg | ORAL_TABLET | Freq: Two times a day (BID) | ORAL | 6 refills | Status: DC

## 2018-12-09 MED ORDER — DIGOXIN 125 MCG PO TABS: 0.1250 mg | ORAL_TABLET | Freq: Every day | ORAL | 6 refills | Status: DC

## 2018-12-09 MED ORDER — POTASSIUM CHLORIDE CRYS ER 20 MEQ PO TBCR: 20.0000 meq | EXTENDED_RELEASE_TABLET | Freq: Every day | ORAL | 6 refills | Status: DC

## 2018-12-09 MED ORDER — SPIRONOLACTONE 25 MG PO TABS: 25.0000 mg | ORAL_TABLET | Freq: Every day | ORAL | 6 refills | Status: DC

## 2018-12-09 MED ORDER — ATORVASTATIN CALCIUM 40 MG PO TABS: 40.0000 mg | ORAL_TABLET | Freq: Every day | ORAL | 6 refills | Status: DC

## 2018-12-09 NOTE — Telephone Encounter (Signed)
Received completed patient portion of [employer] Patient Assistance application for [employer]. Will fax in application once provider's signature is obtained.   Audry Riles, PharmD, BCPS, BCCP, CPP Heart Failure Clinic Pharmacist [phone]

## 2018-12-09 NOTE — Patient Instructions (Signed)
It was a pleasure seeing you today!  MEDICATIONS: -We are changing your medications today -Start hydralazine 12.5 mg (1/2 tablet) twice daily and isosorbide mononitrate 15 mg (1 tablet) daily. -Call if you have questions about your medications.  LABS: -We will call you if your labs need attention.  NEXT APPOINTMENT: Return to clinic in 2 weeks with Dr. Aundra Dubin.  In general, to take care of your heart failure: -Limit your fluid intake to 2 Liters (half-gallon) per day.   -Limit your salt intake to ideally 2-3 grams (2000-3000 mg) per day. -Weigh yourself daily and record, and bring that "weight diary" to your next appointment.  (Weight gain of 2-3 pounds in 1 day typically means fluid weight.) -The medications for your heart are to help your heart and help you live longer.   -Please contact us before stopping any of your heart medications.  Call the clinic at [phone] with questions or to reschedule future appointments.

## 2018-12-14 NOTE — Telephone Encounter (Signed)
Sent in Manufacturer's Assistance application to [employer] for Entresto.   Phone:[phone] Fax: [phone]  Application pending, will continue to follow.  Vianney Kopecky, PharmD, BCPS, BCCP, CPP Heart Failure Clinic Pharmacist [phone]   

## 2018-12-15 NOTE — Telephone Encounter (Signed)
Blood collected for TTR genetic testing per Dr Aundra Dubin.  Order form completed and both shipped by [facility] to [facility] 12/09/2018.

## 2018-12-25 ENCOUNTER — Encounter (HOSPITAL_COMMUNITY): Payer: Self-pay | Admitting: Cardiology

## 2018-12-25 ENCOUNTER — Other Ambulatory Visit: Payer: Self-pay

## 2018-12-25 ENCOUNTER — Ambulatory Visit (HOSPITAL_COMMUNITY)
Admission: RE | Admit: 2018-12-25 | Discharge: 2018-12-25 | Disposition: A | Discharge status: Home / Self Care | Payer: Self-pay | Source: Ambulatory Visit | Attending: Cardiology | Admitting: Cardiology

## 2018-12-25 VITALS — BP 113/50 | HR 97 | Wt 226.0 lb

## 2018-12-25 DIAGNOSIS — Z79899 Other long term (current) drug therapy: Secondary | ICD-10-CM | POA: Insufficient documentation

## 2018-12-25 DIAGNOSIS — I5022 Chronic systolic (congestive) heart failure: Secondary | ICD-10-CM | POA: Insufficient documentation

## 2018-12-25 DIAGNOSIS — Z87828 Personal history of other (healed) physical injury and trauma: Secondary | ICD-10-CM | POA: Insufficient documentation

## 2018-12-25 DIAGNOSIS — E785 Hyperlipidemia, unspecified: Secondary | ICD-10-CM | POA: Insufficient documentation

## 2018-12-25 DIAGNOSIS — Z8249 Family history of ischemic heart disease and other diseases of the circulatory system: Secondary | ICD-10-CM | POA: Insufficient documentation

## 2018-12-25 DIAGNOSIS — I428 Other cardiomyopathies: Secondary | ICD-10-CM | POA: Insufficient documentation

## 2018-12-25 DIAGNOSIS — I110 Hypertensive heart disease with heart failure: Secondary | ICD-10-CM | POA: Insufficient documentation

## 2018-12-25 LAB — BASIC METABOLIC PANEL
Anion gap: 10 (ref 5–15)
BUN: 10 mg/dL (ref 6–20)
CO2: 23 mmol/L (ref 22–32)
Calcium: 9.6 mg/dL (ref 8.9–10.3)
Chloride: 102 mmol/L (ref 98–111)
Creatinine, Ser: 1.36 mg/dL — ABNORMAL HIGH (ref 0.61–1.24)
GFR calc Af Amer: >60 mL/min (ref >60)
GFR calc non Af Amer: >60 mL/min (ref >60)
Glucose, Bld: 102 mg/dL — ABNORMAL HIGH (ref 70–99)
Potassium: 4.2 mmol/L (ref 3.5–5.1)
Sodium: 135 mmol/L (ref 135–145)

## 2018-12-25 LAB — DIGOXIN LEVEL: Digoxin Level: 0.4 ng/mL — ABNORMAL LOW (ref 0.8–2.0)

## 2018-12-25 MED ORDER — BIDIL 20-37.5 MG PO TABS: 1.0000 | ORAL_TABLET | Freq: Three times a day (TID) | ORAL | 2 refills | Status: DC

## 2018-12-25 NOTE — Patient Instructions (Addendum)
Labs done today. We will contact you only if your labs are abnormal.  DISCONTINUE Hydralazine and Imdur   START Bidil 20-37.5mg (1 tab) by mouth 3 times daily.    Please continue all other medication as prescribed  Your physician recommends that you schedule a follow-up appointment in: 3 weeks for a pharmacy only visit and in 2 months with an echo prior to your appointment.  Your physician has requested that you have an echocardiogram. Echocardiography is a painless test that uses sound waves to create images of your heart. It provides your doctor with information about the size and shape of your heart and how well your heart's chambers and valves are working. This procedure takes approximately one hour. There are no restrictions for this procedure.  At the [facility], you and your health needs are our priority. As part of our continuing mission to provide you with exceptional heart care, we have created designated Provider Care Teams. These Care Teams include your primary Cardiologist (physician) and Advanced Practice Providers (APPs- Physician Assistants and Nurse Practitioners) who all work together to provide you with the care you need, when you need it.   You may see any of the following providers on your designated Care Team at your next follow up: Marland Kitchen Dr Glori Bickers . Dr Loralie Champagne . Darrick Grinder, NP . Lyda Jester, PA . Audry Riles, PharmD   Please be sure to bring in all your medications bottles to every appointment.
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
## 2018-12-29 ENCOUNTER — Ambulatory Visit: Payer: Self-pay | Admitting: Critical Care Medicine

## 2019-01-01 ENCOUNTER — Telehealth (HOSPITAL_COMMUNITY): Payer: Self-pay | Admitting: Pharmacist

## 2019-01-01 NOTE — Telephone Encounter (Signed)
Advanced Heart Failure Patient Advocate Encounter   Patient was approved to receive Entresto from [employer].  Effective dates: 12/26/2018-12/26/2019.  Called patient to inform them of decision. Patient aware.   Audry Riles, PharmD, BCPS, BCCP, CPP Heart Failure Clinic Pharmacist [phone]

## 2019-01-21 MED FILL — hydrALAZINE HCL 25 MG TABS: 25 | 30 days supply | Qty: 30 | Fill #1

## 2019-01-21 MED FILL — ISOSORBIDE MN ER 30 MG TAB: 30 | 30 days supply | Qty: 15 | Fill #1

## 2019-01-21 MED FILL — POTASSIUM CHLORIDE CRYS ER: 20 | 30 days supply | Qty: 30 | Fill #1

## 2019-01-21 MED FILL — spIRONOLACTONE 25 MG TABS: 25 | 30 days supply | Qty: 30 | Fill #1

## 2019-01-21 MED FILL — DIGOXIN 0.125 MG TABLET: 125 | 30 days supply | Qty: 30 | Fill #1

## 2019-01-21 MED FILL — ATORVASTATIN 40 MG TABLET: 40 | 30 days supply | Qty: 30 | Fill #1

## 2019-01-21 MED FILL — FUROSEMIDE 20 MG TABS: 20 | 34 days supply | Qty: 100 | Fill #1

## 2019-01-21 MED FILL — CARVEDILOL 6.25 MG TABLET: 6.25 | 30 days supply | Qty: 60 | Fill #1

## 2019-01-26 ENCOUNTER — Inpatient Hospital Stay (HOSPITAL_COMMUNITY): Admission: RE | Admit: 2019-01-26 | Payer: Self-pay | Source: Ambulatory Visit

## 2019-01-26 NOTE — Progress Notes (Signed)
PCP: Patient, No Pcp Per Cardiology: Dr. Aundra Dubin  HPI:  34 y.o. with history of nonischemic cardiomyopathy, HTN, and hyperlipidemia presents for followup of CHF.  Patient developed dyspnea initially in 12/19. He had several weeks of progressive exertional dyspnea and was admitted with CHF. Echo at that time showed EF 20-25%. He was seen by Dr. Stanford Breed and started on meds for cardiomyopathy. There were plans for outpatient evaluation with coronary CTA and cardiac MRI, but these were never done. Patient had 1 followup by telehealth to get his meds refilled but otherwise was not seen after the 12/19 admission. He says that he felt better initially and finally decided to stop taking the medications. He was restarted on meds after his telehealth visit, but not sure he actually took them.   He was re-admitted with CHF in 10/20. Echo this admission with EF 20%, moderate RV systolic dysfunction. He was diuresed.  LHC/RHC after diuresis showed no obstructive CAD, mildly elevated PCWP, decreased cardiac output (CI 1.8).  He was unable to tolerate cardiac MRI due to claustrophobia.   Rare ETOH, no cocaine, no tobacco, occasional THC. His father had his first MI at 42, died from CHF/CAD in his early 84s. Grandmother with CHF.   He recently presented to HF Clinic on 12/25/18 with Dr. Aundra Dubin. At that visit he reported that he had been doing well, was taking all his meds.  Was trying to be more active, starting to ride his bike some.  No significant exertional dyspnea.  No orthopnea/PND.  Weight was down 2 lbs.    Today he returns to HF clinic for pharmacist medication titration. At last visit with MD, hydralazine/isosorbide dinitrate was changed to Bidil and increased to 1 tablet TID. Overall he is doing well today. Does note that he has been out of Entresto for >1 week. Is only taking Bidil twice daily instead of three times daily. He complains of dizziness 2-3 times per week, which occurs at ~2PM. His  wife believes this is related to higher blood pressures around that time. No chest pain or palpitations. No SOB/DOE. He does not weigh himself daily at home, but does weigh himself roughly 3 times per week. His normal weight range is 226-230 lbs. He is 236 lbs on clinic scale today, which is up 10 lbs from last visit. Although he believes some of the weight gain is related to increasing muscle mass as he has been lifting weights, he also thinks he is carrying some fluid today because his abdomen is tight. He takes furosemide 20 mg daily and has not needed extra in "weeks"; however, he did take an extra tablet this afternoon due to abdominal tightness. No LEE, PND or orthopnea. His appetite is good. He is following a low salt diet.      . Shortness of breath/dyspnea on exertion? No   . Orthopnea/PND? No . Edema? No - but does complain of abdominal tightness . Lightheadedness/dizziness? Yes - noted at around 2PM 2-3 times per week. His wife believes this correlates when his BP is higher.  . Daily weights at home? No, weighs himself approximately 3 times per week . Blood pressure/heart rate monitoring at home? yes . Following low-sodium/fluid-restricted diet? Yes  HF Medications: -Carvedilol 6.25 mg BID -Entresto 24/26 mg BID - out of medication >1 week -Spironolactone 25 mg daily -Bidil 20-37.5 mg 1 tablet TID - Noted he only takes this BID because he cannot remember midday dose -Digoxin 0.125 mg daily -Furosemide 20 daily -Potassium 20 mEq  daily  Has the patient been experiencing any side effects to the medications prescribed?  No  Does the patient have any problems obtaining medications due to transportation or finances?   Yes - no prescription insurance. Uses [facility] today. Has been approved for [employer] patient assistance for [employer]. Bidil manufacturer's assistance application is pending.   Understanding of regimen: fair Understanding of indications:  good Potential of compliance: fair - Significant noncompliance noted today. Could benefit from frequent re-education on the importance of his HF regimen.  Patient understands to avoid NSAIDs. Patient understands to avoid decongestants.    Pertinent Lab Values (12/25/18): Marland Kitchen Serum creatinine 1.36, BUN 10, Potassium 4.2, Sodium 135, BNP 72.3 pg/mL (12/09/18), Digoxin <0.4 ng/mL (12/25/18)  Vital Signs: . Weight: 236.4 lbs (last clinic weight: 226 lbs) . Blood pressure: 150/74 . Heart rate: 83   Assessment: 1. Chronic systolic LFY:[id number] cardiomyopathy.  No history of ETOH or cocaine abuse. Multiple family members with CHF. Echo in 12/19 with EF 20-25%, echo 10/20 with EF 20% and moderate RV systolic dysfunction.RHC/LHC in 10/20 showed no significant coronary disease, mildly elevated PCWP, and low cardiac output (CI 1.8).  He was unable to tolerate cardiac MRI.  - NYHA class II symptoms.  Volume status mildly elevated on exam - noted abdominal tightness and 10 lb weight gain since last visit.  -Vitals: BP elevated at 150/74, HR 83 - Continue Lasix 20 daily.  - Continue Carvedilol 6.25 mg BID. Unable to increase today with mild fluid retention.   - Restart Entresto 24/26 mg BID. This will help with elevated BP as well as elevated volume status. Has been out of Entresto for >1 week because ran out of samples. He has been approved for [employer] Patient Assistance Program, but has yet to receive a shipment from them. They will call [employer] in 2-3 days to make sure shipment has been processed. I gave them 1 bottle of samples today.  - Continue Spironolactone 25 mg daily. - Digoxin 0.125 daily. Digoxin level 0.4 ng/mL on 12/25/18. - Continue Bidil 20-37.5 mg 1 tablet TID. Of note has only been taking Bidil BID - I reinforced the need for TID dosing. Bidil manufacturer's assistance is pending.  - Needs close followup, low CO on RHC is concerning.  Would benefit from continued education given  noncompliance.  - Will need repeat echo in 4 more months after he has been on a good medical regimen.  If EF remains low, will need to consider ICD.  He is not CRT candidate.  - Invitae gene testing sent today given family history of CHF. He has 3 young children.  2. Hyperlipidemia:  -Lipid panel 12/2017: LDL 191, HDL 28, TG 129 -Continue atorvastatin 40 mg daily   Plan: 1) Medication changes: Based on clinical presentation, vital signs and recent labs will restart Entresto 24/26 mg BID. Also reinforced the need to take Bidil TID, instead of BID.  3) Follow-up: HF Clinic in 1 month with Dr. Jonn Shingles, PharmD, BCPS, [facility], CPP Heart Failure Clinic Pharmacist [phone]

## 2019-01-29 ENCOUNTER — Telehealth (HOSPITAL_COMMUNITY): Payer: Self-pay | Admitting: Pharmacist

## 2019-01-29 ENCOUNTER — Other Ambulatory Visit (HOSPITAL_COMMUNITY): Payer: Self-pay

## 2019-01-29 MED ORDER — BIDIL 20-37.5 MG PO TABS: 1.0000 | ORAL_TABLET | Freq: Three times a day (TID) | ORAL | 3 refills | Status: DC

## 2019-01-29 NOTE — Telephone Encounter (Signed)
Sent in [employer] Assistance application to [employer] for Bidil PAP.   Phone/Fax: [phone]  Application pending, will continue to follow.  Karle Plumber, PharmD, BCPS, BCCP, CPP Heart Failure Clinic Pharmacist [phone]

## 2019-02-03 ENCOUNTER — Ambulatory Visit (HOSPITAL_COMMUNITY)
Admission: RE | Admit: 2019-02-03 | Discharge: 2019-02-03 | Disposition: A | Discharge status: Home / Self Care | Payer: Medicaid Other | Source: Ambulatory Visit | Attending: Internal Medicine | Admitting: Internal Medicine

## 2019-02-03 ENCOUNTER — Other Ambulatory Visit: Payer: Self-pay

## 2019-02-03 ENCOUNTER — Telehealth (HOSPITAL_COMMUNITY): Payer: Self-pay | Admitting: Pharmacist

## 2019-02-03 VITALS — BP 150/74 | HR 83 | Wt 236.4 lb

## 2019-02-03 DIAGNOSIS — I428 Other cardiomyopathies: Secondary | ICD-10-CM | POA: Insufficient documentation

## 2019-02-03 DIAGNOSIS — I110 Hypertensive heart disease with heart failure: Secondary | ICD-10-CM | POA: Insufficient documentation

## 2019-02-03 DIAGNOSIS — Z7901 Long term (current) use of anticoagulants: Secondary | ICD-10-CM | POA: Insufficient documentation

## 2019-02-03 DIAGNOSIS — I5022 Chronic systolic (congestive) heart failure: Secondary | ICD-10-CM | POA: Insufficient documentation

## 2019-02-03 DIAGNOSIS — R42 Dizziness and giddiness: Secondary | ICD-10-CM | POA: Insufficient documentation

## 2019-02-03 DIAGNOSIS — Z79899 Other long term (current) drug therapy: Secondary | ICD-10-CM | POA: Insufficient documentation

## 2019-02-03 DIAGNOSIS — E785 Hyperlipidemia, unspecified: Secondary | ICD-10-CM | POA: Insufficient documentation

## 2019-02-03 DIAGNOSIS — Z8249 Family history of ischemic heart disease and other diseases of the circulatory system: Secondary | ICD-10-CM | POA: Diagnosis not present

## 2019-02-03 NOTE — Patient Instructions (Addendum)
It was a pleasure seeing you today!  MEDICATIONS: -Restart Entresto at 24/26 mg twice daily -I have submitted Bidil patient assistance application. You should be hearing from them in 1-2 weeks.  -Call if you have questions about your medications.  NEXT APPOINTMENT: Return to clinic in 1 month with Dr. Shirlee Latch.  In general, to take care of your heart failure: -Limit your fluid intake to 2 Liters (half-gallon) per day.   -Limit your salt intake to ideally 2-3 grams (2000-3000 mg) per day. -Weigh yourself daily and record, and bring that "weight diary" to your next appointment.  (Weight gain of 2-3 pounds in 1 day typically means fluid weight.) -The medications for your heart are to help your heart and help you live longer.   -Please contact us before stopping any of your heart medications.  Call the clinic at [phone] with questions or to reschedule future appointments.

## 2019-02-03 NOTE — Telephone Encounter (Signed)
Provided patient with Bidil and Entresto samples.   Medication: Entresto 24/26 - 1 bottle Lot: [id number] Expiration date: 08/2020  Medication: Bidil - 2 bottles Lot: [id number] Expiration date: 01/2020  Dropped samples off at front desk for patient.   Karle Plumber, PharmD, BCPS, CPP Heart Failure Clinic Pharmacist [phone]

## 2019-02-08 ENCOUNTER — Other Ambulatory Visit (HOSPITAL_COMMUNITY): Payer: Self-pay

## 2019-02-17 NOTE — Telephone Encounter (Signed)
Advanced Heart Failure Patient Advocate Encounter   Patient was approved to receive Bidil from [facility] Patient Services.  Effective dates: 02/03/19 through 02/03/20  Karle Plumber, PharmD, BCPS, BCCP, CPP Heart Failure Clinic Pharmacist [phone]

## 2019-02-26 ENCOUNTER — Telehealth (HOSPITAL_COMMUNITY): Payer: Self-pay | Admitting: *Deleted

## 2019-02-26 ENCOUNTER — Telehealth (HOSPITAL_COMMUNITY): Payer: Self-pay | Admitting: Pharmacy Technician

## 2019-02-26 ENCOUNTER — Telehealth (HOSPITAL_COMMUNITY): Payer: Self-pay | Admitting: Pharmacist

## 2019-02-26 MED FILL — FUROSEMIDE 20 MG TABS: 20 | 34 days supply | Qty: 100 | Fill #2

## 2019-02-26 MED FILL — DIGOXIN 0.125 MG TABLET: 125 | 30 days supply | Qty: 30 | Fill #2

## 2019-02-26 MED FILL — POTASSIUM CHLORIDE CRYS ER: 20 | 30 days supply | Qty: 30 | Fill #2

## 2019-02-26 MED FILL — SPIRONOLACTONE 25 MG TABS: 25 | 30 days supply | Qty: 30 | Fill #2

## 2019-02-26 MED FILL — CARVEDILOL 6.25 MG TABLET: 6.25 | 30 days supply | Qty: 60 | Fill #2

## 2019-02-26 MED FILL — ATORVASTATIN 40 MG TABLET: 40 | 30 days supply | Qty: 30 | Fill #2

## 2019-02-26 NOTE — Telephone Encounter (Signed)
Provided patient with Entresto samples.  Medication: Entresto 24/26 mg Quantity: 1 bottle  Lot: [id number] Expiration date: 08/2020  Dropped samples off at front desk for patient.   Of note, I have informed patient that this will be the last samples of Entresto they will receive from the clinic. Patient has been approved to get Sherryll Burger from [employer] since mid-December 2020 and they have not called [employer] to schedule shipment despite being asked to repeatedly.   Karle Plumber, PharmD, BCPS, CPP Heart Failure Clinic Pharmacist [phone]

## 2019-02-26 NOTE — Telephone Encounter (Signed)
Spoke to patient's wife regarding how to order [place] through [employer]. Explained this would be the last sample we could provide since he has been approved for some time.  Transferred patient to triage since she said he was complaining of chest pains.  Archer Asa, CPhT

## 2019-02-26 NOTE — Telephone Encounter (Signed)
Called patient back after speaking with clinic staff. They suggested that the patient go to the ED since providers are gone for the day.   When I called and suggested this to the patient, he stated that he couldn't go to the ED right now and would see them on Monday.  Archer Asa, CPhT

## 2019-02-26 NOTE — Telephone Encounter (Signed)
pts wife left VM stating patient was completely out of entresto and he was having sharp chest pain. I called [phone] as requested and no answer/left vm to return my call. I also see pt spoke with our clinic pharmacist Karle Plumber during the time I was trying to reach pt. Pt was given samples but I do not see a mention of chest pain in her note. Called patient a second time and again no answer/left vm for pt to return call to address chest pain and get more information.

## 2019-03-05 ENCOUNTER — Ambulatory Visit (HOSPITAL_BASED_OUTPATIENT_CLINIC_OR_DEPARTMENT_OTHER)
Admission: RE | Admit: 2019-03-05 | Discharge: 2019-03-05 | Disposition: A | Discharge status: Home / Self Care | Payer: Medicaid Other | Source: Ambulatory Visit | Attending: Cardiology | Admitting: Cardiology

## 2019-03-05 ENCOUNTER — Ambulatory Visit (HOSPITAL_COMMUNITY)
Admission: RE | Admit: 2019-03-05 | Discharge: 2019-03-05 | Disposition: A | Discharge status: Home / Self Care | Payer: Medicaid Other | Source: Ambulatory Visit | Attending: Cardiology | Admitting: Cardiology

## 2019-03-05 ENCOUNTER — Other Ambulatory Visit: Payer: Self-pay

## 2019-03-05 ENCOUNTER — Encounter (HOSPITAL_COMMUNITY): Payer: Self-pay | Admitting: Cardiology

## 2019-03-05 ENCOUNTER — Telehealth (HOSPITAL_COMMUNITY): Payer: Self-pay

## 2019-03-05 VITALS — BP 140/94 | HR 90 | Wt 231.6 lb

## 2019-03-05 DIAGNOSIS — I5022 Chronic systolic (congestive) heart failure: Secondary | ICD-10-CM

## 2019-03-05 DIAGNOSIS — I428 Other cardiomyopathies: Secondary | ICD-10-CM | POA: Diagnosis not present

## 2019-03-05 DIAGNOSIS — Z79899 Other long term (current) drug therapy: Secondary | ICD-10-CM | POA: Insufficient documentation

## 2019-03-05 DIAGNOSIS — Z8249 Family history of ischemic heart disease and other diseases of the circulatory system: Secondary | ICD-10-CM | POA: Insufficient documentation

## 2019-03-05 DIAGNOSIS — I110 Hypertensive heart disease with heart failure: Secondary | ICD-10-CM | POA: Diagnosis not present

## 2019-03-05 DIAGNOSIS — E785 Hyperlipidemia, unspecified: Secondary | ICD-10-CM | POA: Diagnosis not present

## 2019-03-05 DIAGNOSIS — Z7901 Long term (current) use of anticoagulants: Secondary | ICD-10-CM | POA: Insufficient documentation

## 2019-03-05 LAB — BASIC METABOLIC PANEL
Anion gap: 7 (ref 5–15)
BUN: 8 mg/dL (ref 6–20)
CO2: 27 mmol/L (ref 22–32)
Calcium: 9.4 mg/dL (ref 8.9–10.3)
Chloride: 105 mmol/L (ref 98–111)
Creatinine, Ser: 1.51 mg/dL — ABNORMAL HIGH (ref 0.61–1.24)
GFR calc Af Amer: >60 mL/min (ref >60)
GFR calc non Af Amer: 59 mL/min — ABNORMAL LOW (ref >60)
Glucose, Bld: 103 mg/dL — ABNORMAL HIGH (ref 70–99)
Potassium: 4.1 mmol/L (ref 3.5–5.1)
Sodium: 139 mmol/L (ref 135–145)

## 2019-03-05 MED ORDER — SACUBITRIL-VALSARTAN 49-51 MG PO TABS: 1.0000 | ORAL_TABLET | Freq: Two times a day (BID) | ORAL | 4 refills | Status: DC

## 2019-03-05 NOTE — Patient Instructions (Addendum)
INCREASE Entresto to 49/51mg  (1 tab) twice a day   Labs today and repeat in 2 weeks We will only contact you if something comes back abnormal or we need to make some changes. Otherwise no news is good news!   Your provider has recommended that you have a home sleep study.  [employer] is the company that does these test.  They will contact you by phone and must speak with you before they can ship the equipment.  Once they have spoken with you they will send the equipment right to your home with instructions on how to set it up.  Once you have completed the test you just dispose of the equipment, the information is automatically uploaded to [place] via blue-tooth technology.  IF you have any questions or issues with the equipment please call the company directly at [phone].  If your test is positive for sleep apnea and you need a home CPAP machine you will be contacted by Dr Norris Cross office [facility]) to set this up.   Your physician recommends that you schedule a follow-up appointment in: 3 months with Dr Shirlee Latch.     Please call office at [phone] option [phone] if you have any questions or concerns.    At the [facility] Clinic, you and your health needs are our priority. As part of our continuing mission to provide you with exceptional heart care, we have created designated Provider Care Teams. These Care Teams include your primary Cardiologist (physician) and Advanced Practice Providers (APPs- Physician Assistants and Nurse Practitioners) who all work together to provide you with the care you need, when you need it.   You may see any of the following providers on your designated Care Team at your next follow up: Marland Kitchen Dr Arvilla Meres . Dr Marca Ancona . Tonye Becket, NP . Robbie Lis, PA . Karle Plumber, PharmD   Please be sure to bring in all your medications bottles to every appointment.

## 2019-03-05 NOTE — Progress Notes (Addendum)
Patient Name: Stephen Ruiz        DOB: 1985/08/19      Height: 5'10    Weight: 231  Office Name: [facility]         Referring Provider:  Marca Ancona  Today's Date: 03/05/19  Date:   STOP BANG RISK ASSESSMENT S (snore) Have you been told that you snore?     YES   T (tired) Are you often tired, fatigued, or sleepy during the day?   YES  O (obstruction) Do you stop breathing, choke, or gasp during sleep? YES   P (pressure) Do you have or are you being treated for high blood pressure? YES   B (BMI) Is your body index greater than 35 kg/m? YES   A (age) Are you 12 years old or older? NO   N (neck) Do you have a neck circumference greater than 16 inches?   NO   G (gender) Are you a male? YES   TOTAL STOP/BANG "YES" ANSWERS 6                                                                       For Office Use Only              Procedure Order Form    YES to 3+ Stop Bang questions OR two clinical symptoms - patient qualifies for [employer] (CPT 95800)     Submit: This Form + Patient Face Sheet + Clinical Note via [employer] or Fax: [phone]         Clinical Notes: Will consult Sleep Specialist and refer for management of therapy due to patient increased risk of Sleep Apnea. Ordering a sleep study due to the following two clinical symptoms: Excessive daytime sleepiness G47.10 / Gastroesophageal reflux K21.9 / Nocturia R35.1 / Morning Headaches G44.221 / Difficulty concentrating R41.840 / Memory problems or poor judgment G31.84 / Personality changes or irritability R45.4 / Loud snoring R06.83 / Depression F32.9 / Unrefreshed by sleep G47.8 / Impotence N52.9 / History of high blood pressure R03.0 / Insomnia G47.00    I understand that I am proceeding with a home sleep apnea test as ordered by my treating physician. I understand that untreated sleep apnea is a serious cardiovascular risk factor and it is my responsibility to perform the test and seek management  for sleep apnea. I will be contacted with the results and be managed for sleep apnea by a local sleep physician. I will be receiving equipment and further instructions from [facility]. I shall promptly ship back the equipment via the included mailing label. I understand my insurance will be billed for the test and as the patient I am responsible for any insurance related out-of-pocket costs incurred. I have been provided with written instructions and can call for additional video or telephonic instruction, with 24-hour availability of qualified personnel to answer any questions: Patient Help Desk [phone].  Patient Signature ______________________________________________________   Date______________________ Patient Telemedicine Verbal Consent

## 2019-03-05 NOTE — Telephone Encounter (Signed)
-----   Message from Laurey Morale, MD sent at 03/05/2019  4:29 PM EST ----- Stop Lasix.  BMET in 1 week.

## 2019-03-05 NOTE — Progress Notes (Signed)
  Echocardiogram 2D Echocardiogram has been performed.  Pieter Partridge 03/05/2019, 2:42 PM
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
## 2019-03-09 ENCOUNTER — Telehealth (HOSPITAL_COMMUNITY): Payer: Self-pay

## 2019-03-09 NOTE — Telephone Encounter (Signed)
Error

## 2019-03-09 NOTE — Telephone Encounter (Signed)
Pt does not have active insurance. Medicaid pending at the moment. Secondary to this reason, patient would not be approved for [facility] sleep study.  Pt however could have sleep study at [facility] if willing to pay out of pocket. Called patient to discuss, no answer and unable to leave vm.

## 2019-03-09 NOTE — Addendum Note (Signed)
Encounter addended by: Marisa Hua, RN on: 03/09/2019 8:12 AM  Actions taken: Clinical Note Signed

## 2019-03-09 NOTE — Telephone Encounter (Signed)
Order, OV note, stop bang and demographics all faxed to [employer] at [phone]  

## 2019-03-10 ENCOUNTER — Telehealth (HOSPITAL_COMMUNITY): Payer: Self-pay | Admitting: Pharmacist

## 2019-03-10 MED ORDER — SACUBITRIL-VALSARTAN 49-51 MG PO TABS: 1.0000 | ORAL_TABLET | Freq: Two times a day (BID) | ORAL | 4 refills | Status: DC

## 2019-03-10 NOTE — Telephone Encounter (Signed)
Updated Entresto prescription send to [employer] - Patient receives [place] from [employer] Patient Assistance Program.   Karle Plumber, PharmD, BCPS, [facility], CPP Heart Failure Clinic Pharmacist [phone]

## 2019-03-12 ENCOUNTER — Other Ambulatory Visit (HOSPITAL_COMMUNITY): Payer: Medicaid Other

## 2019-03-15 ENCOUNTER — Other Ambulatory Visit (HOSPITAL_COMMUNITY): Payer: Medicaid Other

## 2019-03-19 ENCOUNTER — Other Ambulatory Visit (HOSPITAL_COMMUNITY): Payer: Medicaid Other

## 2019-06-04 ENCOUNTER — Encounter (HOSPITAL_COMMUNITY): Payer: Medicaid Other | Admitting: Cardiology

## 2019-08-11 ENCOUNTER — Other Ambulatory Visit (HOSPITAL_COMMUNITY): Payer: Self-pay | Admitting: Cardiology

## 2019-11-26 ENCOUNTER — Telehealth (HOSPITAL_COMMUNITY): Payer: Self-pay | Admitting: Pharmacy Technician

## 2019-11-26 NOTE — Telephone Encounter (Signed)
It's time to renew the patient's [facility] assistance. The patient is now insured. A prior authorization was submitted through [employer] and approved.   PA [id number]  Effective Dates: 11/26/19 through 11/25/20  Called and spoke with patient. He is overdo for a follow up appointment. Sent him to the main line for scheduling.  Archer Asa, CPhT

## 2020-02-08 ENCOUNTER — Encounter (HOSPITAL_COMMUNITY): Payer: Medicaid Other | Admitting: Cardiology

## 2020-03-06 ENCOUNTER — Other Ambulatory Visit (HOSPITAL_COMMUNITY): Payer: Self-pay

## 2020-03-06 MED ORDER — SACUBITRIL-VALSARTAN 49-51 MG PO TABS: 1.0000 | ORAL_TABLET | Freq: Two times a day (BID) | ORAL | 0 refills | Status: DC

## 2020-07-12 ENCOUNTER — Ambulatory Visit: Payer: Medicaid Other | Admitting: Internal Medicine

## 2021-04-04 ENCOUNTER — Encounter (HOSPITAL_BASED_OUTPATIENT_CLINIC_OR_DEPARTMENT_OTHER): Payer: Self-pay | Admitting: Emergency Medicine

## 2021-04-04 ENCOUNTER — Other Ambulatory Visit: Payer: Self-pay

## 2021-04-04 ENCOUNTER — Emergency Department (HOSPITAL_BASED_OUTPATIENT_CLINIC_OR_DEPARTMENT_OTHER)
Admission: EM | Admit: 2021-04-04 | Discharge: 2021-04-05 | Disposition: A | Discharge status: Home / Self Care | Payer: Medicaid Other | Attending: Emergency Medicine | Admitting: Emergency Medicine

## 2021-04-04 DIAGNOSIS — S0081XA Abrasion of other part of head, initial encounter: Secondary | ICD-10-CM | POA: Insufficient documentation

## 2021-04-04 DIAGNOSIS — S60511A Abrasion of right hand, initial encounter: Secondary | ICD-10-CM | POA: Insufficient documentation

## 2021-04-04 DIAGNOSIS — S6991XA Unspecified injury of right wrist, hand and finger(s), initial encounter: Secondary | ICD-10-CM | POA: Diagnosis present

## 2021-04-04 DIAGNOSIS — Z23 Encounter for immunization: Secondary | ICD-10-CM | POA: Diagnosis not present

## 2021-04-04 DIAGNOSIS — M79641 Pain in right hand: Secondary | ICD-10-CM

## 2021-04-04 MED ORDER — KETOROLAC TROMETHAMINE 60 MG/2ML IM SOLN
15.0000 mg | Freq: Once | INTRAMUSCULAR | Status: AC
  Administered 2021-04-05: 15 mg via INTRAMUSCULAR
  Filled 2021-04-04: qty 2

## 2021-04-04 MED ORDER — TETANUS-DIPHTH-ACELL PERTUSSIS 5-2.5-18.5 LF-MCG/0.5 IM SUSY
0.5000 mL | PREFILLED_SYRINGE | Freq: Once | INTRAMUSCULAR | Status: AC
  Administered 2021-04-05: 0.5 mL via INTRAMUSCULAR
  Filled 2021-04-04: qty 0.5

## 2021-04-04 MED ORDER — AMOXICILLIN-POT CLAVULANATE 875-125 MG PO TABS
1.0000 | ORAL_TABLET | Freq: Once | ORAL | Status: AC
  Administered 2021-04-05: 1 via ORAL
  Filled 2021-04-04: qty 1
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
## 2021-04-05 ENCOUNTER — Emergency Department (HOSPITAL_BASED_OUTPATIENT_CLINIC_OR_DEPARTMENT_OTHER): Payer: Medicaid Other

## 2021-04-05 MED ORDER — AMOXICILLIN-POT CLAVULANATE 875-125 MG PO TABS: 1.0000 | ORAL_TABLET | Freq: Two times a day (BID) | ORAL | 0 refills | Status: DC
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
## 2021-04-06 IMAGING — DX DG CHEST 2V
2 series · 2 of 2 positions shown · non-contrast
Comparison: Chest x-ray 10/16/2018.

CLINICAL DATA: 32-year-old male with history of shortness of
breath.

EXAM:
CHEST - 2 VIEW

[w chest pa]
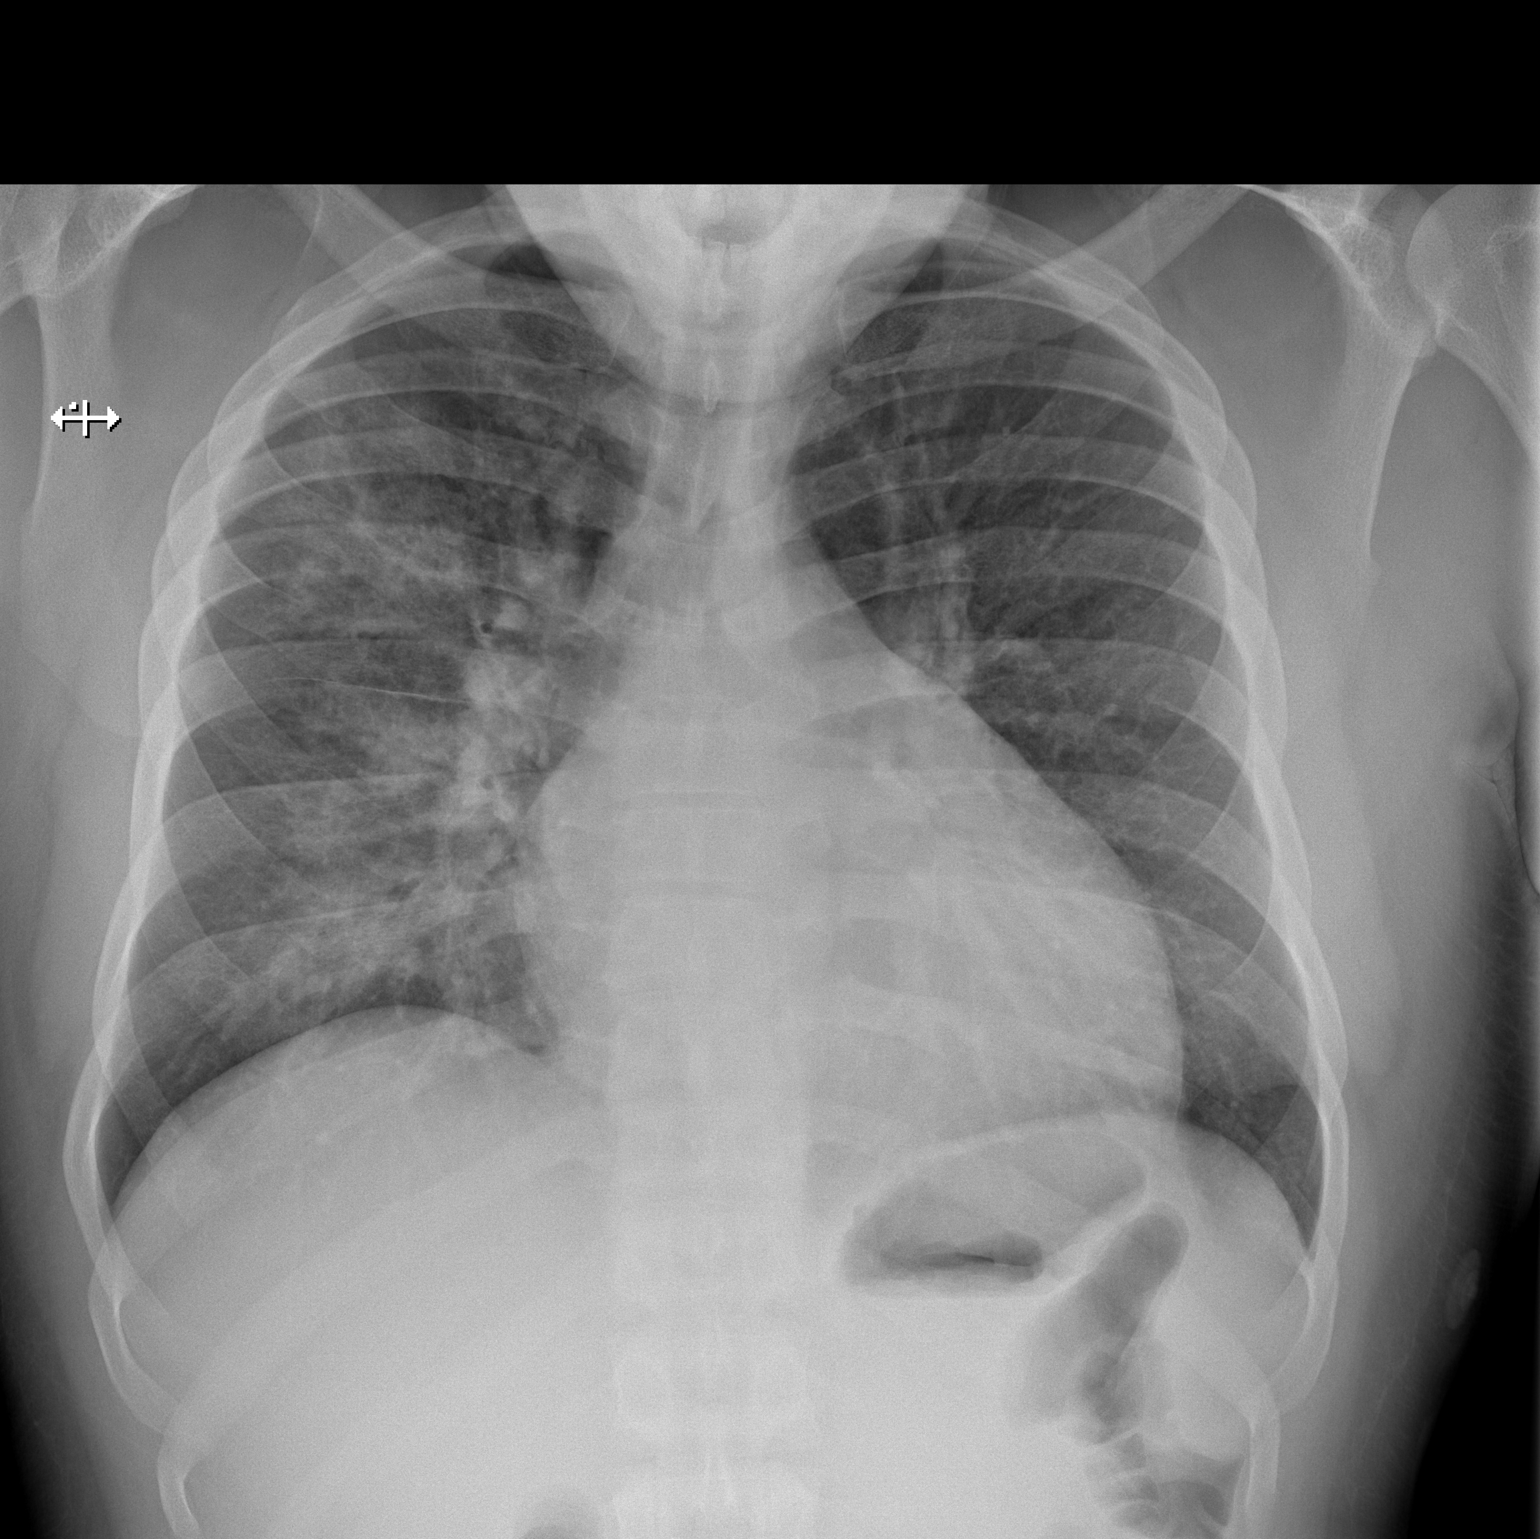

[w chest lat]
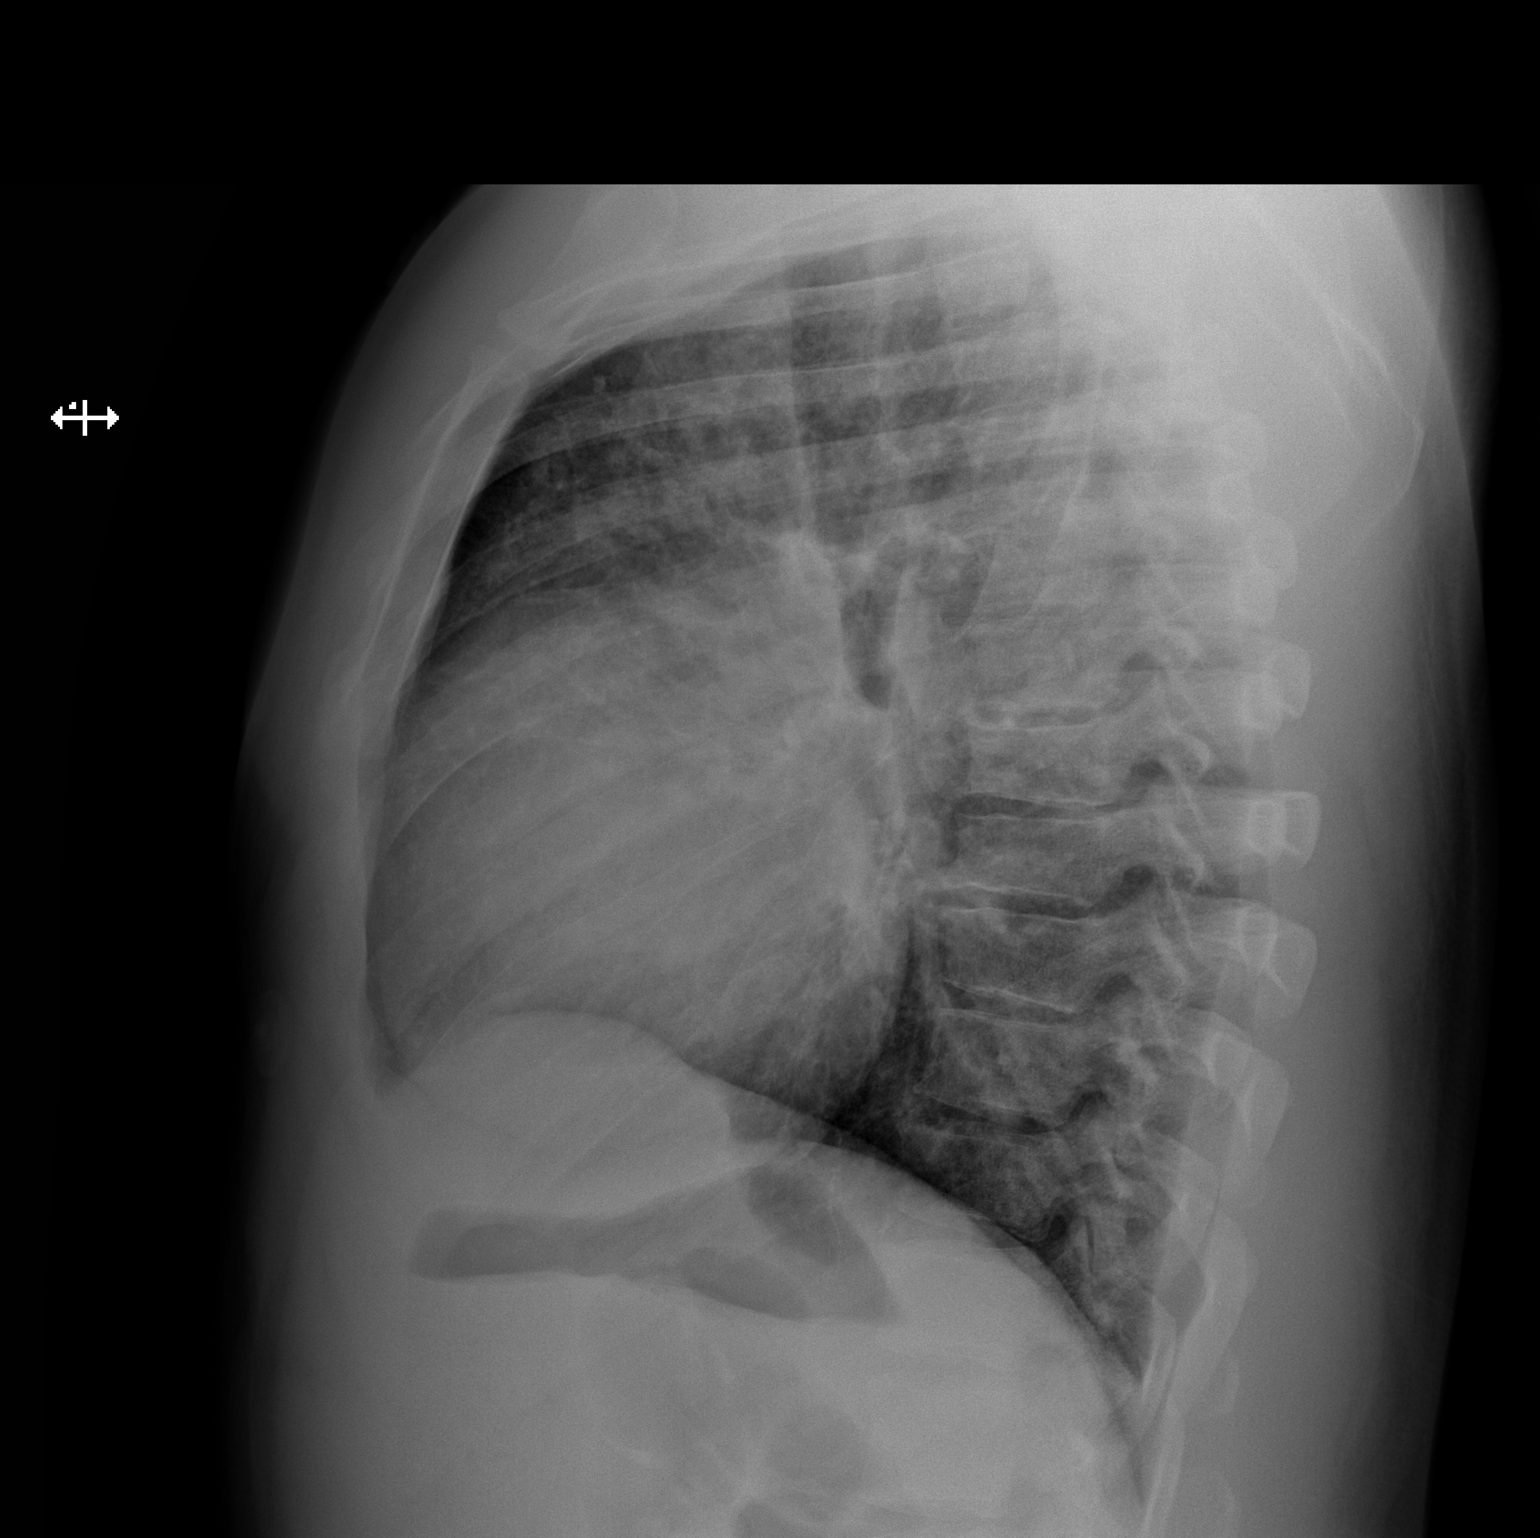

[2 of 2 positions shown; findings below may reference images not displayed]

FINDINGS: Patchy multifocal airspace consolidation throughout the right lung,
compatible with multilobar pneumonia. Left lung appears clear. No
pleural effusions. No evidence of pulmonary edema. Heart size
appears mildly enlarged. Upper mediastinal contours are within
normal limits.
IMPRESSION: 1. Multilobar pneumonia in the right lung. Overall, aeration is
minimally improved compared with yesterday's examination.
2. Mild cardiomegaly, unchanged.

## 2021-11-20 ENCOUNTER — Encounter (HOSPITAL_BASED_OUTPATIENT_CLINIC_OR_DEPARTMENT_OTHER): Payer: Self-pay | Admitting: Emergency Medicine

## 2021-11-20 ENCOUNTER — Emergency Department (HOSPITAL_BASED_OUTPATIENT_CLINIC_OR_DEPARTMENT_OTHER): Payer: Medicaid Other

## 2021-11-20 ENCOUNTER — Emergency Department (HOSPITAL_BASED_OUTPATIENT_CLINIC_OR_DEPARTMENT_OTHER)
Admission: EM | Admit: 2021-11-20 | Discharge: 2021-11-21 | Disposition: A | Discharge status: Home / Self Care | Payer: Medicaid Other | Attending: Emergency Medicine | Admitting: Emergency Medicine

## 2021-11-20 ENCOUNTER — Other Ambulatory Visit: Payer: Self-pay

## 2021-11-20 DIAGNOSIS — Z1152 Encounter for screening for COVID-19: Secondary | ICD-10-CM | POA: Insufficient documentation

## 2021-11-20 DIAGNOSIS — J020 Streptococcal pharyngitis: Secondary | ICD-10-CM | POA: Insufficient documentation

## 2021-11-20 DIAGNOSIS — J029 Acute pharyngitis, unspecified: Secondary | ICD-10-CM | POA: Diagnosis present

## 2021-11-20 LAB — RESP PANEL BY RT-PCR (FLU A&B, COVID) ARPGX2
Influenza A by PCR: NEGATIVE
Influenza B by PCR: NEGATIVE
SARS Coronavirus 2 by RT PCR: NEGATIVE

## 2021-11-20 LAB — GROUP A STREP BY PCR: Group A Strep by PCR: DETECTED — AB

## 2021-11-20 NOTE — ED Triage Notes (Signed)
Cough, sore throat, runny nose since Saturday. Reports one episode of coughing up blood.

## 2021-11-21 MED ORDER — CEPHALEXIN 250 MG PO CAPS
500.0000 mg | ORAL_CAPSULE | Freq: Once | ORAL | Status: AC
  Administered 2021-11-21: 500 mg via ORAL
  Filled 2021-11-21: qty 2

## 2021-11-21 MED ORDER — CEPHALEXIN 500 MG PO CAPS: 500.0000 mg | ORAL_CAPSULE | Freq: Four times a day (QID) | ORAL | 0 refills | Status: DC

## 2021-11-21 NOTE — Discharge Instructions (Signed)
Begin taking cephalexin as prescribed.  Take ibuprofen 600 mg every 6 hours as needed for pain or fever.  Drink plenty of fluids and get plenty of rest.  Return to the emergency department if symptoms significantly worsen or change.

## 2021-11-21 NOTE — ED Provider Notes (Signed)
[facility] EMERGENCY DEPARTMENT Provider Note   CSN: [id number] Arrival date & time: 11/20/21  2217     History  Chief Complaint  Patient presents with   URI    Stephen Ruiz is a 36 y.o. male.  Patient is a 36 year old male presenting with complaints of body aches, sore throat, swollen lymph nodes, and feeling generally unwell.  This started yesterday and is worsening.  He denies any ill contacts.  He denies any aggravating or alleviating factors.  The history is provided by the patient.  URI Presenting symptoms: fatigue, fever and sore throat        Home Medications Prior to Admission medications   Medication Sig Start Date End Date Taking? Authorizing Provider  amoxicillin-clavulanate (AUGMENTIN) 875-125 MG tablet Take 1 tablet by mouth 2 (two) times daily. One po bid x 7 days 04/05/21   Mesner, Barbara Cower, MD      Allergies    Patient has no known allergies.    Review of Systems   Review of Systems  Constitutional:  Positive for fatigue and fever.  HENT:  Positive for sore throat.   All other systems reviewed and are negative.   Physical Exam Updated Vital Signs BP 135/74 (BP Location: Right Arm)   Pulse 90   Temp 98.2 F (36.8 C) (Oral)   Resp 20   Ht 5\' 10"  (1.778 m)   Wt 102.1 kg   SpO2 99%   BMI 32.28 kg/m  Physical Exam Vitals and nursing note reviewed.  Constitutional:      General: He is not in acute distress.    Appearance: He is well-developed. He is not diaphoretic.  HENT:     Head: Normocephalic and atraumatic.     Mouth/Throat:     Mouth: Mucous membranes are moist.     Pharynx: Posterior oropharyngeal erythema present. No oropharyngeal exudate.  Cardiovascular:     Rate and Rhythm: Normal rate and regular rhythm.     Heart sounds: No murmur heard.    No friction rub.  Pulmonary:     Effort: Pulmonary effort is normal. No respiratory distress.     Breath sounds: Normal breath sounds. No wheezing or rales.  Abdominal:      General: Bowel sounds are normal. There is no distension.     Palpations: Abdomen is soft.     Tenderness: There is no abdominal tenderness.  Musculoskeletal:        General: Normal range of motion.     Cervical back: Normal range of motion and neck supple. No rigidity.  Lymphadenopathy:     Cervical: No cervical adenopathy.  Skin:    General: Skin is warm and dry.  Neurological:     Mental Status: He is alert and oriented to person, place, and time.     Coordination: Coordination normal.     ED Results / Procedures / Treatments   Labs (all labs ordered are listed, but only abnormal results are displayed) Labs Reviewed  GROUP A STREP BY PCR - Abnormal; Notable for the following components:      Result Value   Group A Strep by PCR DETECTED (*)    All other components within normal limits  RESP PANEL BY RT-PCR (FLU A&B, COVID) [id number]    EKG None  Radiology DG Chest 2 View  Result Date: 11/20/2021 CLINICAL DATA:  Productive cough. EXAM: CHEST - 2 VIEW COMPARISON:  10/17/2018 FINDINGS: Chronic cardiomegaly. Unchanged mediastinal contours. There is no focal airspace disease. No  pulmonary edema. No pleural fluid or pneumothorax. No acute osseous abnormalities are seen. IMPRESSION: Chronic cardiomegaly. No acute chest findings. Electronically Signed   By: Keith Rake M.D.   On: 11/20/2021 22:39    Procedures Procedures    Medications Ordered in ED Medications - No data to display  ED Course/ Medical Decision Making/ A&P  Patient is a 36 year old male presenting with sore throat, fatigue as described in the HPI.  Work-up initiated including respiratory panel which was negative for COVID, flu, and RSV.  Chest x-ray obtained showing no acute process.  Strep test is positive.  Patient to be treated with Keflex and ibuprofen.  To return as needed.  Final Clinical Impression(s) / ED Diagnoses Final diagnoses:  None    Rx / DC Orders ED Discharge Orders     None          Veryl Speak, MD 11/21/21 [phone]

## 2022-04-30 ENCOUNTER — Encounter: Payer: Self-pay | Admitting: *Deleted

## 2022-07-09 ENCOUNTER — Encounter (HOSPITAL_BASED_OUTPATIENT_CLINIC_OR_DEPARTMENT_OTHER): Payer: Self-pay

## 2022-07-09 ENCOUNTER — Other Ambulatory Visit: Payer: Self-pay

## 2022-07-09 DIAGNOSIS — L0501 Pilonidal cyst with abscess: Secondary | ICD-10-CM | POA: Insufficient documentation

## 2022-07-09 NOTE — ED Triage Notes (Signed)
Small abscess noted right below coccyx Noticed it 2 weeks ago Increased pain and purulent drainage

## 2022-07-10 ENCOUNTER — Emergency Department (HOSPITAL_BASED_OUTPATIENT_CLINIC_OR_DEPARTMENT_OTHER)
Admission: EM | Admit: 2022-07-10 | Discharge: 2022-07-10 | Disposition: A | Discharge status: Home / Self Care | Payer: 59 | Attending: Emergency Medicine | Admitting: Emergency Medicine

## 2022-07-10 DIAGNOSIS — L0501 Pilonidal cyst with abscess: Secondary | ICD-10-CM | POA: Diagnosis not present

## 2022-07-10 MED ORDER — HYDROCODONE-ACETAMINOPHEN 5-325 MG PO TABS: 1.0000 | ORAL_TABLET | Freq: Four times a day (QID) | ORAL | 0 refills | Status: AC | PRN

## 2022-07-10 MED ORDER — DOXYCYCLINE HYCLATE 100 MG PO TABS
100.0000 mg | ORAL_TABLET | Freq: Once | ORAL | Status: AC
  Administered 2022-07-10: 100 mg via ORAL
  Filled 2022-07-10: qty 1

## 2022-07-10 MED ORDER — HYDROCODONE-ACETAMINOPHEN 5-325 MG PO TABS
1.0000 | ORAL_TABLET | Freq: Once | ORAL | Status: AC
  Administered 2022-07-10: 1 via ORAL
  Filled 2022-07-10: qty 1

## 2022-07-10 MED ORDER — DOXYCYCLINE HYCLATE 100 MG PO CAPS: 100.0000 mg | ORAL_CAPSULE | Freq: Two times a day (BID) | ORAL | 0 refills | Status: AC

## 2022-07-10 NOTE — ED Provider Notes (Signed)
  [place] [facility] AT [facility] Provider Note   CSN: [id number] Arrival date & time: 07/09/22  2227     History  Chief Complaint  Patient presents with   Abscess    Stephen Ruiz is a 37 y.o. male.  The history is provided by the patient and the spouse.   Patient reports he has had a boil appear on his sacral region over 1-2 weeks ago.  He has had pain, swelling and is now draining.  No fevers or vomiting.  He is not diabetic.  He has never had this before  He is having bowel movements without difficulty  Home Medications Prior to Admission medications   Medication Sig Start Date End Date Taking? Authorizing Provider  doxycycline (VIBRAMYCIN) 100 MG capsule Take 1 capsule (100 mg total) by mouth 2 (two) times daily. One po bid x 7 days 07/10/22  Yes Zadie Rhine, MD  HYDROcodone-acetaminophen (NORCO/VICODIN) 5-325 MG tablet Take 1 tablet by mouth every 6 (six) hours as needed for severe pain. 07/10/22  Yes Zadie Rhine, MD      Allergies    Patient has no known allergies.    Review of Systems   Review of Systems  Constitutional:  Negative for fever.  Gastrointestinal:  Negative for vomiting.    Physical Exam Updated Vital Signs BP (!) 150/69   Pulse 72   Temp 98.2 F (36.8 C) (Oral)   Resp 18   Ht 1.778 m (5\' 10" )   Wt 108.4 kg   SpO2 97%   BMI 34.29 kg/m  Physical Exam CONSTITUTIONAL: Well developed/well nourished HEAD: Normocephalic/atraumatic NEURO: Pt is awake/alert/appropriate, moves all extremitiesx4.  No facial droop.   SKIN: Exam chaperoned by nurse Ranette Patient appears to have a small pilonidal abscess that is actively draining.  Minimal erythema.  No crepitus. There is no signs of perirectal abscess PSYCH: Anxious  ED Results / Procedures / Treatments   Labs (all labs ordered are listed, but only abnormal results are displayed) Labs Reviewed - No data to display  EKG None  Radiology No results  found.  Procedures Procedures    Medications Ordered in ED Medications  doxycycline (VIBRA-TABS) tablet 100 mg (100 mg Oral Given 07/10/22 0142)  HYDROcodone-acetaminophen (NORCO/VICODIN) 5-325 MG per tablet 1 tablet (1 tablet Oral Given 07/10/22 0142)    ED Course/ Medical Decision Making/ A&P                             Medical Decision Making Risk Prescription drug management.   Patient has pilonidal abscess that is actively draining.  Advised wound care, will start antibiotics.  Will refer to general surgery. No indication for bedside I&D at this time.  Patient is not septic appearing  he is overall well-appearing        Final Clinical Impression(s) / ED Diagnoses Final diagnoses:  Pilonidal abscess    Rx / DC Orders ED Discharge Orders          Ordered    doxycycline (VIBRAMYCIN) 100 MG capsule  2 times daily        07/10/22 0134    HYDROcodone-acetaminophen (NORCO/VICODIN) 5-325 MG tablet  Every 6 hours PRN        07/10/22 0134              Zadie Rhine, MD 07/10/22 0245

## 2023-08-13 ENCOUNTER — Emergency Department (HOSPITAL_BASED_OUTPATIENT_CLINIC_OR_DEPARTMENT_OTHER)
Admission: EM | Admit: 2023-08-13 | Discharge: 2023-08-13 | Disposition: A | Discharge status: Home / Self Care | Attending: Emergency Medicine | Admitting: Emergency Medicine

## 2023-08-13 ENCOUNTER — Encounter (HOSPITAL_BASED_OUTPATIENT_CLINIC_OR_DEPARTMENT_OTHER): Payer: Self-pay | Admitting: Radiology

## 2023-08-13 ENCOUNTER — Other Ambulatory Visit: Payer: Self-pay

## 2023-08-13 DIAGNOSIS — Z77120 Contact with and (suspected) exposure to mold (toxic): Secondary | ICD-10-CM | POA: Diagnosis not present

## 2023-08-13 DIAGNOSIS — R0602 Shortness of breath: Secondary | ICD-10-CM | POA: Insufficient documentation

## 2023-08-13 NOTE — ED Triage Notes (Signed)
 October they moved to a new location and found out there was mold in the apartment. 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 He is having cough, shortness of breath and feels he has been sleeping more than normal.

## 2023-08-13 NOTE — Discharge Instructions (Signed)
 [facility] at [facility] [place]. [address] [place],  [place]  [address] Main: [phone]   [facility] at [facility] [address] [place]. [address] [place],  [place]  [address] Main: [phone]

## 2023-08-13 NOTE — ED Provider Notes (Signed)
 [place] [facility] AT [facility] Provider Note   CSN: [id number] Arrival date & time: 08/13/23  8057     Patient presents with: mold exposure   Stephen Ruiz is a 38 y.o. male presents with concern for mold exposure.  Patient complains of shortness of breath at all times.  Not associated with cough or congestion.  Seems to be worse in the morning and when going upstairs.  Has no cardiac history or respiratory issues.  Her whole family is with similar symptoms.  They do not have a fireplace or gas stove.   HPI      Prior to Admission medications   Medication Sig Start Date End Date Taking? Authorizing Provider  doxycycline  (VIBRAMYCIN ) 100 MG capsule Take 1 capsule (100 mg total) by mouth 2 (two) times daily. One po bid x 7 days 07/10/22   Midge Golas, MD  HYDROcodone -acetaminophen  (NORCO/VICODIN) 5-325 MG tablet Take 1 tablet by mouth every 6 (six) hours as needed for severe pain. 07/10/22   Midge Golas, MD    Allergies: Patient has no known allergies.    Review of Systems  Respiratory:  Positive for shortness of breath.     Updated Vital Signs BP (!) 147/67 (BP Location: Left Arm)   Pulse 94   Temp 98.2 F (36.8 C) (Oral)   Resp 18   Ht 5' 10 (1.778 m)   Wt 106.6 kg   SpO2 96%   BMI 33.72 kg/m   Physical Exam Vitals and nursing note reviewed.  Constitutional:      General: He is not in acute distress.    Appearance: He is well-developed.  HENT:     Head: Normocephalic and atraumatic.  Eyes:     Conjunctiva/sclera: Conjunctivae normal.  Cardiovascular:     Rate and Rhythm: Normal rate and regular rhythm.     Heart sounds: No murmur heard. Pulmonary:     Effort: Pulmonary effort is normal. No respiratory distress.     Breath sounds: Normal breath sounds.  Abdominal:     Palpations: Abdomen is soft.     Tenderness: There is no abdominal tenderness.  Musculoskeletal:        General: No swelling.     Cervical back: Neck  supple.  Skin:    General: Skin is warm and dry.     Capillary Refill: Capillary refill takes less than 2 seconds.  Neurological:     Mental Status: He is alert.  Psychiatric:        Mood and Affect: Mood normal.     (all labs ordered are listed, but only abnormal results are displayed) Labs Reviewed - No data to display  EKG: None  Radiology: No results found.   Procedures   Medications Ordered in the ED - No data to display                                  Medical Decision Making  This patient presents to the ED with chief complaint(s) of shortness of breath.  The complaint involves an extensive differential diagnosis and also carries with it a high risk of complications and morbidity.   Pertinent past medical history as listed in HPI  The differential diagnosis includes  URI, pneumonia, carbon monoxide Additional history obtained: Additional history obtained from family Records reviewed Care Everywhere/External Records  Assessment and management:   Hemodynamically stable, nontoxic-appearing patient presenting with complaints of shortness  of breath that has been intermittent over the past 3 months since moving into a new apartment.  The entire family is with similar symptoms.  No headache or altered mental status.  No fireplace or gas stove.  No risk factors for carbon monoxide exposure, however I recommended considering a carbon monoxide alarm.  Patient is additionally not hypoxic.  Lungs are clear.  Patient is without any other URI symptoms.  Do not feel that any labs or imaging are indicated today here in the ED.  Will provide referral for allergy and asthma center.    Independent ECG interpretation:  none  Independent labs interpretation:  The following labs were independently interpreted:  none  Independent visualization and interpretation of imaging: I independently visualized the following imaging with scope of interpretation limited to determining acute life  threatening conditions related to emergency care: none    Consultations obtained:   none  Disposition:   Patient will be discharged home. The patient has been appropriately medically screened and/or stabilized in the ED. I have low suspicion for any other emergent medical condition which would require further screening, evaluation or treatment in the ED or require inpatient management. At time of discharge the patient is hemodynamically stable and in no acute distress. I have discussed work-up results and diagnosis with patient and answered all questions. Patient is agreeable with discharge plan. We discussed strict return precautions for returning to the emergency department and they verbalized understanding.     Social Determinants of Health:   Patient's impaired access to primary care  increases the complexity of managing their presentation  This note was dictated with voice recognition software.  Despite best efforts at proofreading, errors may have occurred which can change the documentation meaning.      Final diagnoses:  Shortness of breath    ED Discharge Orders     None          Donnajean Lynwood VEAR DEVONNA 08/13/23 2059    Towana Ozell BROCKS, MD 08/14/23 [phone]
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
